# Patient Record
Sex: Female | Born: 1965 | Hispanic: No | Marital: Single | Smoking: Never smoker
Health system: Southern US, Community
[De-identification: ages and names within clinical notes are randomized; demographics above are authoritative.]

---

## 2017-05-15 ENCOUNTER — Other Ambulatory Visit: Payer: Self-pay | Admitting: Physician Assistant

## 2017-05-15 DIAGNOSIS — Z1231 Encounter for screening mammogram for malignant neoplasm of breast: Secondary | ICD-10-CM

## 2019-10-25 ENCOUNTER — Encounter (HOSPITAL_BASED_OUTPATIENT_CLINIC_OR_DEPARTMENT_OTHER): Payer: Self-pay | Admitting: Emergency Medicine

## 2019-10-25 ENCOUNTER — Observation Stay (HOSPITAL_BASED_OUTPATIENT_CLINIC_OR_DEPARTMENT_OTHER)
Admission: EM | Admit: 2019-10-25 | Discharge: 2019-10-27 | Disposition: A | Discharge status: Home / Self Care | Payer: BLUE CROSS/BLUE SHIELD | Attending: Physician Assistant | Admitting: Physician Assistant

## 2019-10-25 ENCOUNTER — Emergency Department (HOSPITAL_BASED_OUTPATIENT_CLINIC_OR_DEPARTMENT_OTHER): Payer: BLUE CROSS/BLUE SHIELD

## 2019-10-25 DIAGNOSIS — Z419 Encounter for procedure for purposes other than remedying health state, unspecified: Secondary | ICD-10-CM

## 2019-10-25 DIAGNOSIS — Z20822 Contact with and (suspected) exposure to covid-19: Secondary | ICD-10-CM | POA: Insufficient documentation

## 2019-10-25 DIAGNOSIS — K819 Cholecystitis, unspecified: Secondary | ICD-10-CM | POA: Diagnosis present

## 2019-10-25 DIAGNOSIS — K8000 Calculus of gallbladder with acute cholecystitis without obstruction: Principal | ICD-10-CM | POA: Insufficient documentation

## 2019-10-25 DIAGNOSIS — R1010 Upper abdominal pain, unspecified: Secondary | ICD-10-CM | POA: Diagnosis present

## 2019-10-25 LAB — COMPREHENSIVE METABOLIC PANEL
ALT: 19 U/L (ref 0–44)
AST: 25 U/L (ref 15–41)
Albumin: 4.5 g/dL (ref 3.5–5.0)
Alkaline Phosphatase: 51 U/L (ref 38–126)
Anion gap: 10 (ref 5–15)
BUN: 10 mg/dL (ref 6–20)
CO2: 25 mmol/L (ref 22–32)
Calcium: 9.1 mg/dL (ref 8.9–10.3)
Chloride: 97 mmol/L — ABNORMAL LOW (ref 98–111)
Creatinine, Ser: 0.56 mg/dL (ref 0.44–1.00)
GFR, Estimated: >60 mL/min (ref >60)
Glucose, Bld: 132 mg/dL — ABNORMAL HIGH (ref 70–99)
Potassium: 3.5 mmol/L (ref 3.5–5.1)
Sodium: 132 mmol/L — ABNORMAL LOW (ref 135–145)
Total Bilirubin: 0.4 mg/dL (ref 0.3–1.2)
Total Protein: 8.3 g/dL — ABNORMAL HIGH (ref 6.5–8.1)

## 2019-10-25 LAB — PREGNANCY, URINE: Preg Test, Ur: NEGATIVE

## 2019-10-25 LAB — URINALYSIS, ROUTINE W REFLEX MICROSCOPIC
Bilirubin Urine: NEGATIVE
Glucose, UA: NEGATIVE mg/dL
Ketones, ur: NEGATIVE mg/dL
Leukocytes,Ua: NEGATIVE
Nitrite: NEGATIVE
Protein, ur: NEGATIVE mg/dL
Specific Gravity, Urine: >1.03 — ABNORMAL HIGH (ref 1.005–1.030)
pH: 6 (ref 5.0–8.0)

## 2019-10-25 LAB — CBC
HCT: 28.2 % — ABNORMAL LOW (ref 36.0–46.0)
Hemoglobin: 8.2 g/dL — ABNORMAL LOW (ref 12.0–15.0)
MCH: 19.4 pg — ABNORMAL LOW (ref 26.0–34.0)
MCHC: 29.1 g/dL — ABNORMAL LOW (ref 30.0–36.0)
MCV: 66.8 fL — ABNORMAL LOW (ref 80.0–100.0)
Platelets: 264 10*3/uL (ref 150–400)
RBC: 4.22 MIL/uL (ref 3.87–5.11)
RDW: 17.6 % — ABNORMAL HIGH (ref 11.5–15.5)
WBC: 19.2 10*3/uL — ABNORMAL HIGH (ref 4.0–10.5)
nRBC: 0 % (ref 0.0–0.2)

## 2019-10-25 LAB — URINALYSIS, MICROSCOPIC (REFLEX)

## 2019-10-25 LAB — LIPASE, BLOOD: Lipase: 29 U/L (ref 11–51)

## 2019-10-25 LAB — RESPIRATORY PANEL BY RT PCR (FLU A&B, COVID)
Influenza A by PCR: NEGATIVE
Influenza B by PCR: NEGATIVE
SARS Coronavirus 2 by RT PCR: NEGATIVE

## 2019-10-25 MED ORDER — ENOXAPARIN SODIUM 40 MG/0.4ML ~~LOC~~ SOLN: 40.0000 mg | SUBCUTANEOUS | Status: DC

## 2019-10-25 MED ORDER — ONDANSETRON HCL 4 MG/2ML IJ SOLN
4.0000 mg | Freq: Once | INTRAMUSCULAR | Status: AC | PRN
  Administered 2019-10-25: 4 mg via INTRAVENOUS
  Filled 2019-10-25: qty 2

## 2019-10-25 MED ORDER — ENOXAPARIN SODIUM 40 MG/0.4ML ~~LOC~~ SOLN
SUBCUTANEOUS | Status: AC
  Filled 2019-10-25: qty 0.4

## 2019-10-25 MED ORDER — PANTOPRAZOLE SODIUM 40 MG IV SOLR
40.0000 mg | Freq: Once | INTRAVENOUS | Status: AC
  Administered 2019-10-25: 40 mg via INTRAVENOUS
  Filled 2019-10-25: qty 40

## 2019-10-25 MED ORDER — MORPHINE SULFATE (PF) 4 MG/ML IV SOLN
4.0000 mg | Freq: Once | INTRAVENOUS | Status: AC
  Administered 2019-10-25: 4 mg via INTRAVENOUS
  Filled 2019-10-25: qty 1

## 2019-10-25 MED ORDER — MORPHINE SULFATE (PF) 2 MG/ML IV SOLN
2.0000 mg | INTRAVENOUS | Status: DC | PRN
  Administered 2019-10-25 – 2019-10-26 (×2): 2 mg via INTRAVENOUS
  Filled 2019-10-25 (×2): qty 1

## 2019-10-25 MED ORDER — ALUM & MAG HYDROXIDE-SIMETH 200-200-20 MG/5ML PO SUSP
30.0000 mL | Freq: Once | ORAL | Status: AC
  Administered 2019-10-25: 30 mL via ORAL
  Filled 2019-10-25: qty 30

## 2019-10-25 MED ORDER — SODIUM CHLORIDE 0.9 % IV SOLN
2.0000 g | Freq: Once | INTRAVENOUS | Status: AC
  Administered 2019-10-25: 2 g via INTRAVENOUS
  Filled 2019-10-25: qty 20

## 2019-10-25 MED ORDER — ONDANSETRON 4 MG PO TBDP
4.0000 mg | ORAL_TABLET | Freq: Four times a day (QID) | ORAL | Status: DC | PRN
  Filled 2019-10-25: qty 1

## 2019-10-25 MED ORDER — DIPHENHYDRAMINE HCL 25 MG PO CAPS
25.0000 mg | ORAL_CAPSULE | Freq: Four times a day (QID) | ORAL | Status: DC | PRN
  Filled 2019-10-25: qty 1

## 2019-10-25 MED ORDER — SODIUM CHLORIDE 0.9 % IV SOLN: 2.0000 g | INTRAVENOUS | Status: DC

## 2019-10-25 MED ORDER — IOHEXOL 300 MG/ML  SOLN
75.0000 mL | Freq: Once | INTRAMUSCULAR | Status: AC | PRN
  Administered 2019-10-25: 100 mL via INTRAVENOUS

## 2019-10-25 MED ORDER — SODIUM CHLORIDE 0.9 % IV BOLUS
500.0000 mL | Freq: Once | INTRAVENOUS | Status: AC
  Administered 2019-10-25: 500 mL via INTRAVENOUS

## 2019-10-25 MED ORDER — LIDOCAINE VISCOUS HCL 2 % MT SOLN
15.0000 mL | Freq: Once | OROMUCOSAL | Status: AC
  Administered 2019-10-25: 15 mL via ORAL
  Filled 2019-10-25: qty 15

## 2019-10-25 MED ORDER — ONDANSETRON HCL 4 MG/2ML IJ SOLN: 4.0000 mg | Freq: Four times a day (QID) | INTRAMUSCULAR | Status: DC | PRN

## 2019-10-25 MED ORDER — DIPHENHYDRAMINE HCL 50 MG/ML IJ SOLN: 25.0000 mg | Freq: Four times a day (QID) | INTRAMUSCULAR | Status: DC | PRN

## 2019-10-25 MED ORDER — KCL IN DEXTROSE-NACL 20-5-0.45 MEQ/L-%-% IV SOLN
INTRAVENOUS | Status: DC
  Filled 2019-10-25 (×2): qty 1000

## 2019-10-25 NOTE — Progress Notes (Signed)
Patient arrived via EMS on stretcher at 2315. Admissions MD notified via AMION at 2319.

## 2019-10-25 NOTE — ED Notes (Signed)
Pt speaks Vietnamese  

## 2019-10-25 NOTE — ED Provider Notes (Signed)
[facility] EMERGENCY DEPARTMENT Provider Note   CSN: [id number] Arrival date & time: 10/25/19  1725     History Chief Complaint  Patient presents with  . Abdominal Pain    Amber Gardner is a 54 y.o. female.  Amber Gardner is a 54 y.o. female who is otherwise healthy, presents to the emergency department for evaluation of upper abdominal pain.  She states that pain started around 1 AM this morning and has been a constant aching feeling.  She reports associated nausea with one episode of vomiting around noon.  She denies any hematemesis.  Reports normal bowel movements that have been nonbloody, no melena.  She denies any fevers or chills.  Patient's daughter adds that she has been complaining of similar episodes of this pain intermittently since Friday but today has been the worst.  At first they thought that she just had food poisoning or had eaten something that bothered her stomach, but her daughter was concerned when pain was worse and more persistent today.  No prior history of abdominal surgeries.  Patient denies dysuria or urinary frequency.  No vaginal bleeding but does report history of heavy periods, most recently on 10/11.  Patient did take some Pepcid to try and help symptoms prior to arrival without relief.  No other aggravating or alleviating factors.        History reviewed. No pertinent past medical history.  Patient Active Problem List   Diagnosis Date Noted  . Cholecystitis 10/25/2019    History reviewed. No pertinent surgical history.   OB History   No obstetric history on file.     No family history on file.  Social History   Tobacco Use  . Smoking status: Never Smoker  . Smokeless tobacco: Never Used  Substance Use Topics  . Alcohol use: Not Currently  . Drug use: Never    Home Medications Prior to Admission medications   Not on File    Allergies    Patient has no known allergies.  Review of Systems   Review of Systems  Constitutional:  Negative for chills and fever.  HENT: Negative.   Respiratory: Negative for cough and shortness of breath.   Cardiovascular: Negative for chest pain.  Gastrointestinal: Positive for abdominal pain, nausea and vomiting. Negative for blood in stool, constipation and diarrhea.  Genitourinary: Negative for dysuria, flank pain, frequency and pelvic pain.  Musculoskeletal: Negative for arthralgias and myalgias.  Skin: Negative for color change and rash.  Neurological: Negative for dizziness, syncope and light-headedness.  All other systems reviewed and are negative.  Physical Exam Updated Vital Signs BP (!) 146/93   Pulse 99   Temp 99.2 F (37.3 C) (Oral)   Resp 18   Ht 5\' 4"  (1.626 m)   Wt 52.2 kg   LMP 10/12/2019   SpO2 99%   BMI 19.74 kg/m   Physical Exam  Vitals and nursing note reviewed.  Constitutional:      General: She is not in acute distress.    Appearance: She is well-developed. She is not diaphoretic.     Comments: Mildly ill-appearing but in no acute distress  HENT:     Head: Normocephalic and atraumatic.     Mouth/Throat:     Mouth: Mucous membranes are moist.     Pharynx: Oropharynx is clear.  Eyes:     General:        Right eye: No discharge.        Left eye: No discharge.  Pupils: Pupils are equal, round, and reactive to light.  Cardiovascular:     Rate and Rhythm: Normal rate and regular rhythm.     Heart sounds: Normal heart sounds.  Pulmonary:     Effort: Pulmonary effort is normal. No respiratory distress.     Breath sounds: Normal breath sounds. No wheezing or rales.     Comments: Respirations equal and unlabored, patient able to speak in full sentences, lungs clear to auscultation bilaterally Abdominal:     General: Bowel sounds are normal. There is no distension.     Palpations: Abdomen is soft. There is no mass.     Tenderness: There is abdominal tenderness in the epigastric area. There is no guarding.     Comments: Abdomen is soft,  nondistended, bowel sounds present throughout, there is tenderness primarily in the epigastric region without guarding or rebound tenderness, patient without focal right upper quadrant tenderness, no lower abdominal tenderness or CVA tenderness bilaterally.  No guarding or peritoneal signs.  Musculoskeletal:        General: No deformity.     Cervical back: Neck supple.  Skin:    General: Skin is warm and dry.     Capillary Refill: Capillary refill takes less than 2 seconds.  Neurological:     Mental Status: She is alert and oriented to person, place, and time.     Coordination: Coordination normal.     Comments: Speech is clear, able to follow commands Moves extremities without ataxia, coordination intact  Psychiatric:        Mood and Affect: Mood normal.        Behavior: Behavior normal.   ED Results / Procedures / Treatments   Labs (all labs ordered are listed, but only abnormal results are displayed) Labs Reviewed  COMPREHENSIVE METABOLIC PANEL - Abnormal; Notable for the following components:      Result Value   Sodium 132 (*)    Chloride 97 (*)    Glucose, Bld 132 (*)    Total Protein 8.3 (*)    All other components within normal limits  URINALYSIS, ROUTINE W REFLEX MICROSCOPIC - Abnormal; Notable for the following components:   Specific Gravity, Urine >1.030 (*)    Hgb urine dipstick TRACE (*)    All other components within normal limits  URINALYSIS, MICROSCOPIC (REFLEX) - Abnormal; Notable for the following components:   Bacteria, UA MANY (*)    All other components within normal limits  CBC - Abnormal; Notable for the following components:   WBC 19.2 (*)    Hemoglobin 8.2 (*)    HCT 28.2 (*)    MCV 66.8 (*)    MCH 19.4 (*)    MCHC 29.1 (*)    RDW 17.6 (*)    All other components within normal limits  RESPIRATORY PANEL BY RT PCR (FLU A&B, COVID)  LIPASE, BLOOD  PREGNANCY, URINE  HIV ANTIBODY (ROUTINE TESTING W REFLEX)  COMPREHENSIVE METABOLIC PANEL  CBC     EKG EKG Interpretation  Date/Time:  Sunday October 25 2019 17:41:32 EDT Ventricular Rate:  80 PR Interval:  138 QRS Duration: 68 QT Interval:  388 QTC Calculation: 447 R Axis:   62 Text Interpretation: Normal sinus rhythm Nonspecific ST abnormality Abnormal ECG No old tracing to compare Confirmed by Meridee Score [phone]) on 10/25/2019 6:08:27 PM   Radiology CT ABDOMEN PELVIS W CONTRAST  Result Date: 10/25/2019 CLINICAL DATA:  Epigastric pain. EXAM: CT ABDOMEN AND PELVIS WITH CONTRAST TECHNIQUE: Multidetector CT imaging of  the abdomen and pelvis was performed using the standard protocol following bolus administration of intravenous contrast. CONTRAST:  OMNIPAQUE IOHEXOL 300 MG/ML  SOLN COMPARISON:  None. FINDINGS: Lower chest: There is a trace right-sided pleural effusion with adjacent atelectasis.The heart size is normal. Hepatobiliary: The liver is normal. There appears to be diffuse gallbladder wall thickening. The gallbladder is distended. Multiple gallstones are noted. There appear to be gallstones at the level of the cystic duct. There is intrahepatic and extrahepatic biliary ductal dilatation. Pancreas: Normal contours without ductal dilatation. No peripancreatic fluid collection. Spleen: Unremarkable. Adrenals/Urinary Tract: --Adrenal glands: Unremarkable. --Right kidney/ureter: No hydronephrosis or radiopaque kidney stones. --Left kidney/ureter: No hydronephrosis or radiopaque kidney stones. --Urinary bladder: Unremarkable. Stomach/Bowel: --Stomach/Duodenum: No hiatal hernia or other gastric abnormality. Normal duodenal course and caliber. --Small bowel: Unremarkable. --Colon: Unremarkable. --Appendix: Normal. Vascular/Lymphatic: Normal course and caliber of the major abdominal vessels. --No retroperitoneal lymphadenopathy. --No mesenteric lymphadenopathy. --No pelvic or inguinal lymphadenopathy. Reproductive: The uterus is enlarged with multiple fibroids. Other: There is a  small volume of pelvic free fluid which is likely physiologic. No free air. The abdominal wall is normal. Musculoskeletal. No acute displaced fractures. IMPRESSION: 1. Findings concerning for acute cholecystitis with gallstones at the level of the cystic duct. There is intrahepatic and extrahepatic biliary ductal dilatation which raises suspicion for choledocholithiasis. Consider further evaluation by ultrasound as clinically indicated. 2. Trace right-sided pleural effusion with adjacent atelectasis. 3. Fibroid uterus. 4. Small volume of pelvic free fluid is likely physiologic. Electronically Signed   By: Katherine Mantle M.D.   On: 10/25/2019 20:06   US Abdomen Limited RUQ (LIVER/GB)  Result Date: 10/25/2019 CLINICAL DATA:  Epigastric pain with nausea and vomiting. Abnormal CT scan today. Positive Murphy's. EXAM: ULTRASOUND ABDOMEN LIMITED RIGHT UPPER QUADRANT COMPARISON:  Abdominopelvic CT earlier today. FINDINGS: Gallbladder: Distended containing intraluminal gallstones as well as a 1 cm shadowing stone in the cystic duct. There is diffuse gallbladder wall thickening at 4 mm. Small amount of pericholecystic fluid. Positive sonographic Eulah Pont sign was noted by the sonographer. Common bile duct: Diameter: Dilated at 8 mm.  No demonstrated choledocholithiasis. Liver: Cyst in the left lobe of the liver on CT is not well seen by ultrasound. There is central intrahepatic biliary ductal dilatation. Portal vein is patent on color Doppler imaging with normal direction of blood flow towards the liver. Other: None. IMPRESSION: 1. Sonographic findings consistent with acute cholecystitis. Distended gallbladder with intraluminal gallstones, 1 cm stone in the cystic duct, gallbladder wall thickening, pericholecystic fluid, and positive sonographic Murphy sign. 2. Intrahepatic and extrahepatic biliary ductal dilatation, common bile duct measures 8 mm. No demonstrated choledocholithiasis by ultrasound. Electronically  Signed   By: Narda Rutherford M.D.   On: 10/25/2019 21:05    Procedures Procedures (including critical care time)  Medications Ordered in ED Medications  enoxaparin (LOVENOX) injection 40 mg (has no administration in time range)  dextrose 5 % and 0.45 % NaCl with KCl 20 mEq/L infusion ( Intravenous New Bag/Given 10/25/19 2204)  cefTRIAXone (ROCEPHIN) 2 g in sodium chloride 0.9 % 100 mL IVPB (has no administration in time range)  morphine 2 MG/ML injection 2 mg (has no administration in time range)  diphenhydrAMINE (BENADRYL) capsule 25 mg (has no administration in time range)    Or  diphenhydrAMINE (BENADRYL) injection 25 mg (has no administration in time range)  ondansetron (ZOFRAN-ODT) disintegrating tablet 4 mg (has no administration in time range)    Or  ondansetron (ZOFRAN) injection 4 mg (has  no administration in time range)  enoxaparin (LOVENOX) 40 MG/0.4ML injection (has no administration in time range)  ondansetron (ZOFRAN) injection 4 mg (4 mg Intravenous Given 10/25/19 1753)  sodium chloride 0.9 % bolus 500 mL (0 mLs Intravenous Stopped 10/25/19 2050)  pantoprazole (PROTONIX) injection 40 mg (40 mg Intravenous Given 10/25/19 1927)  alum & mag hydroxide-simeth (MAALOX/MYLANTA) 200-200-20 MG/5ML suspension 30 mL (30 mLs Oral Given 10/25/19 1925)    And  lidocaine (XYLOCAINE) 2 % viscous mouth solution 15 mL (15 mLs Oral Given 10/25/19 1925)  iohexol (OMNIPAQUE) 300 MG/ML solution 75 mL (100 mLs Intravenous Contrast Given 10/25/19 1952)  morphine 4 MG/ML injection 4 mg (4 mg Intravenous Given 10/25/19 2101)  cefTRIAXone (ROCEPHIN) 2 g in sodium chloride 0.9 % 100 mL IVPB (0 g Intravenous Stopped 10/25/19 2134)    ED Course  I have reviewed the triage vital signs and the nursing notes.  Pertinent labs & imaging results that were available during my care of the patient were reviewed by me and considered in my medical decision making (see chart for details).    MDM  Rules/Calculators/A&P                         Patient presents to the ED with complaints of epigastric abdominal pain and vomiting. Patient nontoxic appearing, in no apparent distress, vitals WNL aside from mild hypertension. On exam patient tender to in the epigastric region without guarding, no focal right upper quadrant tenderness or other tenderness, no peritoneal signs. Will evaluate with labs. Analgesics, anti-emetics, and fluids administered.   I have independently ordered, reviewed and interpreted all labs and imaging:  CBC: Significant leukocytosis of 19.2, hemoglobin of 8.2, which appears to be chronic, most recent prior hemoglobin of 8.8, patient with history of heavy menses CMP: Sodium of 132, chloride of 97 but no other significant electrolyte derangements, normal renal and liver function Lipase: WNL UA: Many bacteria noted but with no other signs of infection, patient without urinary symptoms, do not feel that this will require treatment at this time Preg test: Negative  Patient with continued pain despite initial treatment with GI cocktail, Protonix and Zofran, given significant leukocytosis will get CT to further evaluate.  CT with findings concerning for acute cholecystitis with gallstones at the level of the cystic duct, there is also intra and extrahepatic ductal dilatation which raises concern for choledocholithiasis, recommend ultrasound for further evaluation.  Fibroid uterus noted as well.  Ultrasound with sonographic findings again consistent with acute cholecystitis, distended gallbladder with 1 cm stone in the cystic duct, wall thickening, pericholecystic fluid, there is again intrahepatic and extrahepatic ductal dilatation noted but no demonstrated choledocholithiasis.  Patient has no elevated LFTs which is reassuring.  Will consult general surgery.  Case discussed with Dr. Maisie Fushomas with general surgery who accepts patient for transfer and admission to surgery service at  [facility] for chole[facility].  Temporary admit orders have been placed, patient will be transported via [facility] to [facility].  I have discussed care plan with patient and her daughter at length and answered all questions.   Final Clinical Impression(s) / ED Diagnoses Final diagnoses:  Cholecystitis    Rx / DC Orders ED Discharge Orders    None       Legrand RamsFord, Kebra Lowrimore N, PA-C 10/25/19 2228    Terrilee FilesButler, Michael C, MD 10/26/19 1106

## 2019-10-25 NOTE — ED Triage Notes (Addendum)
Epigastric pain and vomiting since 1am. Took famotidine without relief

## 2019-10-25 NOTE — ED Notes (Signed)
Pt vomited in triage

## 2019-10-25 NOTE — ED Notes (Signed)
Unsuccessful phlebectomy attempt [facility]

## 2019-10-25 NOTE — H&P (Signed)
CC: RUQ pain  Requesting provider: Jodi Geralds, PA  HPI: Amber Gardner is an 54 y.o. female who is here for epigastric pain with nausea and vomiting.  She has had this pain for 3 days.  No fevers.    History reviewed. No pertinent past medical history.  History reviewed. No pertinent surgical history.  No family history on file.  Social:  reports that she has never smoked. She has never used smokeless tobacco. She reports previous alcohol use. She reports that she does not use drugs.  Allergies: No Known Allergies  Medications: I have reviewed the patient's current medications.  Results for orders placed or performed during the hospital encounter of 10/25/19 (from the past 48 hour(s))  Lipase, blood     Status: None   Collection Time: 10/25/19  5:50 PM  Result Value Ref Range   Lipase 29 11 - 51 U/L    Comment: Performed at [facility], [address] [place], [place] [address]  Comprehensive metabolic panel     Status: Abnormal   Collection Time: 10/25/19  5:50 PM  Result Value Ref Range   Sodium 132 (L) 135 - 145 mmol/L   Potassium 3.5 3.5 - 5.1 mmol/L   Chloride 97 (L) 98 - 111 mmol/L   CO2 25 22 - 32 mmol/L   Glucose, Bld 132 (H) 70 - 99 mg/dL    Comment: Glucose reference range applies only to samples taken after fasting for at least 8 hours.   BUN 10 6 - 20 mg/dL   Creatinine, Ser 6.04 0.44 - 1.00 mg/dL   Calcium 9.1 8.9 - 54.0 mg/dL   Total Protein 8.3 (H) 6.5 - 8.1 g/dL   Albumin 4.5 3.5 - 5.0 g/dL   AST 25 15 - 41 U/L   ALT 19 0 - 44 U/L   Alkaline Phosphatase 51 38 - 126 U/L   Total Bilirubin 0.4 0.3 - 1.2 mg/dL   GFR, Estimated >98 >11 mL/min    Comment: (NOTE) Calculated using the CKD-EPI Creatinine Equation (2021)    Anion gap 10 5 - 15    Comment: Performed at [facility], [address] [place], [place] [address]  Urinalysis, Routine w reflex microscopic     Status: Abnormal   Collection Time: 10/25/19  5:51 PM  Result Value Ref  Range   Color, Urine YELLOW YELLOW   APPearance CLEAR CLEAR   Specific Gravity, Urine >1.030 (H) 1.005 - 1.030   pH 6.0 5.0 - 8.0   Glucose, UA NEGATIVE NEGATIVE mg/dL   Hgb urine dipstick TRACE (A) NEGATIVE   Bilirubin Urine NEGATIVE NEGATIVE   Ketones, ur NEGATIVE NEGATIVE mg/dL   Protein, ur NEGATIVE NEGATIVE mg/dL   Nitrite NEGATIVE NEGATIVE   Leukocytes,Ua NEGATIVE NEGATIVE    Comment: Performed at [facility], [address] [place], [place] [address]  Pregnancy, urine     Status: None   Collection Time: 10/25/19  5:51 PM  Result Value Ref Range   Preg Test, Ur NEGATIVE NEGATIVE    Comment:        THE SENSITIVITY OF THIS METHODOLOGY IS >20 mIU/mL. Performed at [facility], [address] [place], [place] [address]   Urinalysis, Microscopic (reflex)     Status: Abnormal   Collection Time: 10/25/19  5:51 PM  Result Value Ref Range   RBC / HPF 0-5 0 - 5 RBC/hpf   WBC, UA 0-5 0 -  5 WBC/hpf   Bacteria, UA MANY (A) NONE SEEN   Squamous Epithelial / LPF 0-5 0 - 5   Mucus PRESENT     Comment: Performed at [facility], [address] [place], [place] [address]  CBC     Status: Abnormal   Collection Time: 10/25/19  7:20 PM  Result Value Ref Range   WBC 19.2 (H) 4.0 - 10.5 K/uL   RBC 4.22 3.87 - 5.11 MIL/uL   Hemoglobin 8.2 (L) 12.0 - 15.0 g/dL    Comment: Reticulocyte Hemoglobin testing may be clinically indicated, consider ordering this additional test [id number]    HCT 28.2 (L) 36 - 46 %   MCV 66.8 (L) 80.0 - 100.0 fL   MCH 19.4 (L) 26.0 - 34.0 pg   MCHC 29.1 (L) 30.0 - 36.0 g/dL   RDW 54.2 (H) 70.6 - 23.7 %   Platelets 264 150 - 400 K/uL   nRBC 0.0 0.0 - 0.2 %    Comment: Performed at [facility], [address] [place], [place] [address]  Respiratory Panel by RT PCR (Flu A&B, Covid) - Nasopharyngeal Swab     Status: None   Collection Time: 10/25/19  8:51 PM   Specimen: Nasopharyngeal Swab  Result Value Ref Range    SARS Coronavirus 2 by RT PCR NEGATIVE NEGATIVE    Comment: (NOTE) SARS-CoV-2 target nucleic acids are NOT DETECTED.  The SARS-CoV-2 RNA is generally detectable in upper respiratoy specimens during the acute phase of infection. The lowest concentration of SARS-CoV-2 viral copies this assay can detect is 131 copies/mL. A negative result does not preclude SARS-Cov-2 infection and should not be used as the sole basis for treatment or other patient management decisions. A negative result may occur with  improper specimen collection/handling, submission of specimen other than nasopharyngeal swab, presence of viral mutation(s) within the areas targeted by this assay, and inadequate number of viral copies (<131 copies/mL). A negative result must be combined with clinical observations, patient history, and epidemiological information. The expected result is Negative.  Fact Sheet for Patients:  https://www.moore.com/  Fact Sheet for Healthcare Providers:  https://www.young.biz/  This test is no t yet approved or cleared by the [place] FDA and  has been authorized for detection and/or diagnosis of SARS-CoV-2 by FDA under an Emergency Use Authorization (EUA). This EUA will remain  in effect (meaning this test can be used) for the duration of the COVID-19 declaration under Section 564(b)(1) of the Act, 21 U.S.C. section 360bbb-3(b)(1), unless the authorization is terminated or revoked sooner.     Influenza A by PCR NEGATIVE NEGATIVE   Influenza B by PCR NEGATIVE NEGATIVE    Comment: (NOTE) The Xpert Xpress SARS-CoV-2/FLU/RSV assay is intended as an aid in  the diagnosis of influenza from Nasopharyngeal swab specimens and  should not be used as a sole basis for treatment. Nasal washings and  aspirates are unacceptable for Xpert Xpress SARS-CoV-2/FLU/RSV  testing.  Fact Sheet for Patients: https://www.moore.com/  Fact Sheet  for Healthcare Providers: https://www.young.biz/  This test is not yet approved or cleared by the [place] FDA and  has been authorized for detection and/or diagnosis of SARS-CoV-2 by  FDA under an Emergency Use Authorization (EUA). This EUA will remain  in effect (meaning this test can be used) for the duration of the  Covid-19 declaration under Section 564(b)(1) of the Act, 21  U.S.C. section 360bbb-3(b)(1), unless the authorization is  terminated or revoked. Performed  at [facility], [address] [place], [place] [address]     CT ABDOMEN PELVIS W CONTRAST  Result Date: 10/25/2019 CLINICAL DATA:  Epigastric pain. EXAM: CT ABDOMEN AND PELVIS WITH CONTRAST TECHNIQUE: Multidetector CT imaging of the abdomen and pelvis was performed using the standard protocol following bolus administration of intravenous contrast. CONTRAST:  OMNIPAQUE IOHEXOL 300 MG/ML  SOLN COMPARISON:  None. FINDINGS: Lower chest: There is a trace right-sided pleural effusion with adjacent atelectasis.The heart size is normal. Hepatobiliary: The liver is normal. There appears to be diffuse gallbladder wall thickening. The gallbladder is distended. Multiple gallstones are noted. There appear to be gallstones at the level of the cystic duct. There is intrahepatic and extrahepatic biliary ductal dilatation. Pancreas: Normal contours without ductal dilatation. No peripancreatic fluid collection. Spleen: Unremarkable. Adrenals/Urinary Tract: --Adrenal glands: Unremarkable. --Right kidney/ureter: No hydronephrosis or radiopaque kidney stones. --Left kidney/ureter: No hydronephrosis or radiopaque kidney stones. --Urinary bladder: Unremarkable. Stomach/Bowel: --Stomach/Duodenum: No hiatal hernia or other gastric abnormality. Normal duodenal course and caliber. --Small bowel: Unremarkable. --Colon: Unremarkable. --Appendix: Normal. Vascular/Lymphatic: Normal course and caliber of the major  abdominal vessels. --No retroperitoneal lymphadenopathy. --No mesenteric lymphadenopathy. --No pelvic or inguinal lymphadenopathy. Reproductive: The uterus is enlarged with multiple fibroids. Other: There is a small volume of pelvic free fluid which is likely physiologic. No free air. The abdominal wall is normal. Musculoskeletal. No acute displaced fractures. IMPRESSION: 1. Findings concerning for acute cholecystitis with gallstones at the level of the cystic duct. There is intrahepatic and extrahepatic biliary ductal dilatation which raises suspicion for choledocholithiasis. Consider further evaluation by ultrasound as clinically indicated. 2. Trace right-sided pleural effusion with adjacent atelectasis. 3. Fibroid uterus. 4. Small volume of pelvic free fluid is likely physiologic. Electronically Signed   By: Katherine Mantle M.D.   On: 10/25/2019 20:06   US Abdomen Limited RUQ (LIVER/GB)  Result Date: 10/25/2019 CLINICAL DATA:  Epigastric pain with nausea and vomiting. Abnormal CT scan today. Positive Murphy's. EXAM: ULTRASOUND ABDOMEN LIMITED RIGHT UPPER QUADRANT COMPARISON:  Abdominopelvic CT earlier today. FINDINGS: Gallbladder: Distended containing intraluminal gallstones as well as a 1 cm shadowing stone in the cystic duct. There is diffuse gallbladder wall thickening at 4 mm. Small amount of pericholecystic fluid. Positive sonographic Eulah Pont sign was noted by the sonographer. Common bile duct: Diameter: Dilated at 8 mm.  No demonstrated choledocholithiasis. Liver: Cyst in the left lobe of the liver on CT is not well seen by ultrasound. There is central intrahepatic biliary ductal dilatation. Portal vein is patent on color Doppler imaging with normal direction of blood flow towards the liver. Other: None. IMPRESSION: 1. Sonographic findings consistent with acute cholecystitis. Distended gallbladder with intraluminal gallstones, 1 cm stone in the cystic duct, gallbladder wall thickening,  pericholecystic fluid, and positive sonographic Murphy sign. 2. Intrahepatic and extrahepatic biliary ductal dilatation, common bile duct measures 8 mm. No demonstrated choledocholithiasis by ultrasound. Electronically Signed   By: Narda Rutherford M.D.   On: 10/25/2019 21:05    ROS - all of the below systems have been reviewed with the patient and positives are indicated with bold text General: chills, fever or night sweats Eyes: blurry vision or double vision ENT: epistaxis or sore throat Allergy/Immunology: itchy/watery eyes or nasal congestion Hematologic/Lymphatic: bleeding problems, blood clots or swollen lymph nodes Endocrine: temperature intolerance or unexpected weight changes Breast: new or changing breast lumps or nipple discharge Resp: cough, shortness of breath, or wheezing CV: chest pain or dyspnea on exertion GI: as  per HPI GU: dysuria, trouble voiding, or hematuria MSK: joint pain or joint stiffness Neuro: TIA or stroke symptoms Derm: pruritus and skin lesion changes Psych: anxiety and depression  PE Blood pressure 130/86, pulse 96, temperature 100.2 F (37.9 C), temperature source Oral, resp. rate (!) 25, height 5\' 4"  (1.626 m), weight 52.2 kg, last menstrual period 10/12/2019, SpO2 99 %. Constitutional: NAD; conversant; no deformities Eyes: Moist conjunctiva; no lid lag; anicteric; PERRL Neck: Trachea midline; no thyromegaly Lungs: Normal respiratory effort; no tactile fremitus CV: RRR; no palpable thrills; no pitting edema GI: Abd RUQ TTP; no palpable hepatosplenomegaly MSK: Normal range of motion of extremities; no clubbing/cyanosis Psychiatric: Appropriate affect; alert and oriented x3 Lymphatic: No palpable cervical or axillary lymphadenopathy  Results for orders placed or performed during the hospital encounter of 10/25/19 (from the past 48 hour(s))  Lipase, blood     Status: None   Collection Time: 10/25/19  5:50 PM  Result Value Ref Range   Lipase 29 11 -  51 U/L    Comment: Performed at [facility] [address] [place], [place]  Comprehensive metabolic panel     Status: Abnormal   Collection Time: 10/25/19  5:50 PM  Result Value Ref Range   Sodium 132 (L) 135 - 145 mmol/L   Potassium 3.5 3.5 - 5.1 mmol/L   Chloride 97 (L) 98 - 111 mmol/L   CO2 25 22 - 32 mmol/L   Glucose, Bld 132 (H) 70 - 99 mg/dL    Comment: Glucose reference range applies only to samples taken after fasting for at least 8 hours.   BUN 10 6 - 20 mg/dL   Creatinine, Ser 10/27/19 0.44 - 1.00 mg/dL   Calcium 9.1 8.9 - 9.62 mg/dL   Total Protein 8.3 (H) 6.5 - 8.1 g/dL   Albumin 4.5 3.5 - 5.0 g/dL   AST 25 15 - 41 U/L   ALT 19 0 - 44 U/L   Alkaline Phosphatase 51 38 - 126 U/L   Total Bilirubin 0.4 0.3 - 1.2 mg/dL   GFR, Estimated 83.6 >62 mL/min    Comment: (NOTE) Calculated using the CKD-EPI Creatinine Equation (2021)    Anion gap 10 5 - 15    Comment: Performed at [facility], [address] [place], [place]  Urinalysis, Routine w reflex microscopic     Status: Abnormal   Collection Time: 10/25/19  5:51 PM  Result Value Ref Range   Color, Urine YELLOW YELLOW   APPearance CLEAR CLEAR   Specific Gravity, Urine >1.030 (H) 1.005 - 1.030   pH 6.0 5.0 - 8.0   Glucose, UA NEGATIVE NEGATIVE mg/dL   Hgb urine dipstick TRACE (A) NEGATIVE   Bilirubin Urine NEGATIVE NEGATIVE   Ketones, ur NEGATIVE NEGATIVE mg/dL   Protein, ur NEGATIVE NEGATIVE mg/dL   Nitrite NEGATIVE NEGATIVE   Leukocytes,Ua NEGATIVE NEGATIVE    Comment: Performed at [facility], [address] [place], [place]  Pregnancy, urine     Status: None   Collection Time: 10/25/19  5:51 PM  Result Value Ref Range   Preg Test, Ur NEGATIVE NEGATIVE    Comment:        THE SENSITIVITY OF THIS METHODOLOGY IS >20 mIU/mL. Performed at [facility], [address] [place], [place]   Urinalysis, Microscopic (reflex)      Status: Abnormal   Collection Time: 10/25/19  5:51 PM  Result Value Ref Range   RBC / HPF 0-5 0 - 5 RBC/hpf   WBC, UA 0-5 0 - 5 WBC/hpf   Bacteria, UA MANY (A) NONE SEEN   Squamous Epithelial / LPF 0-5 0 - 5   Mucus PRESENT     Comment: Performed at [facility], [address] [place], [place] [address]  CBC     Status: Abnormal   Collection Time: 10/25/19  7:20 PM  Result Value Ref Range   WBC 19.2 (H) 4.0 - 10.5 K/uL   RBC 4.22 3.87 - 5.11 MIL/uL   Hemoglobin 8.2 (L) 12.0 - 15.0 g/dL    Comment: Reticulocyte Hemoglobin testing may be clinically indicated, consider ordering this additional test [id number]    HCT 28.2 (L) 36 - 46 %   MCV 66.8 (L) 80.0 - 100.0 fL   MCH 19.4 (L) 26.0 - 34.0 pg   MCHC 29.1 (L) 30.0 - 36.0 g/dL   RDW 11.917.6 (H) 14.711.5 - 82.915.5 %   Platelets 264 150 - 400 K/uL   nRBC 0.0 0.0 - 0.2 %    Comment: Performed at [facility], [address] [place], [place] [address]  Respiratory Panel by RT PCR (Flu A&B, Covid) - Nasopharyngeal Swab     Status: None   Collection Time: 10/25/19  8:51 PM   Specimen: Nasopharyngeal Swab  Result Value Ref Range   SARS Coronavirus 2 by RT PCR NEGATIVE NEGATIVE    Comment: (NOTE) SARS-CoV-2 target nucleic acids are NOT DETECTED.  The SARS-CoV-2 RNA is generally detectable in upper respiratoy specimens during the acute phase of infection. The lowest concentration of SARS-CoV-2 viral copies this assay can detect is 131 copies/mL. A negative result does not preclude SARS-Cov-2 infection and should not be used as the sole basis for treatment or other patient management decisions. A negative result may occur with  improper specimen collection/handling, submission of specimen other than nasopharyngeal swab, presence of viral mutation(s) within the areas targeted by this assay, and inadequate number of viral copies (<131 copies/mL). A negative result must be combined with clinical observations, patient  history, and epidemiological information. The expected result is Negative.  Fact Sheet for Patients:  https://www.moore.com/https://www.fda.gov/media/142436/download  Fact Sheet for Healthcare Providers:  https://www.young.biz/https://www.fda.gov/media/142435/download  This test is no t yet approved or cleared by the [place] FDA and  has been authorized for detection and/or diagnosis of SARS-CoV-2 by FDA under an Emergency Use Authorization (EUA). This EUA will remain  in effect (meaning this test can be used) for the duration of the COVID-19 declaration under Section 564(b)(1) of the Act, 21 U.S.C. section 360bbb-3(b)(1), unless the authorization is terminated or revoked sooner.     Influenza A by PCR NEGATIVE NEGATIVE   Influenza B by PCR NEGATIVE NEGATIVE    Comment: (NOTE) The Xpert Xpress SARS-CoV-2/FLU/RSV assay is intended as an aid in  the diagnosis of influenza from Nasopharyngeal swab specimens and  should not be used as a sole basis for treatment. Nasal washings and  aspirates are unacceptable for Xpert Xpress SARS-CoV-2/FLU/RSV  testing.  Fact Sheet for Patients: https://www.moore.com/https://www.fda.gov/media/142436/download  Fact Sheet for Healthcare Providers: https://www.young.biz/https://www.fda.gov/media/142435/download  This test is not yet approved or cleared by the [place] FDA and  has been authorized for detection and/or diagnosis of SARS-CoV-2 by  FDA under an Emergency Use Authorization (EUA). This EUA will remain  in effect (meaning this test can be used) for the duration of the  Covid-19 declaration  under Section 564(b)(1) of the Act, 21  U.S.C. section 360bbb-3(b)(1), unless the authorization is  terminated or revoked. Performed at [facility], [address] [place], [place] [address]     CT ABDOMEN PELVIS W CONTRAST  Result Date: 10/25/2019 CLINICAL DATA:  Epigastric pain. EXAM: CT ABDOMEN AND PELVIS WITH CONTRAST TECHNIQUE: Multidetector CT imaging of the abdomen and pelvis was performed using the  standard protocol following bolus administration of intravenous contrast. CONTRAST:  OMNIPAQUE IOHEXOL 300 MG/ML  SOLN COMPARISON:  None. FINDINGS: Lower chest: There is a trace right-sided pleural effusion with adjacent atelectasis.The heart size is normal. Hepatobiliary: The liver is normal. There appears to be diffuse gallbladder wall thickening. The gallbladder is distended. Multiple gallstones are noted. There appear to be gallstones at the level of the cystic duct. There is intrahepatic and extrahepatic biliary ductal dilatation. Pancreas: Normal contours without ductal dilatation. No peripancreatic fluid collection. Spleen: Unremarkable. Adrenals/Urinary Tract: --Adrenal glands: Unremarkable. --Right kidney/ureter: No hydronephrosis or radiopaque kidney stones. --Left kidney/ureter: No hydronephrosis or radiopaque kidney stones. --Urinary bladder: Unremarkable. Stomach/Bowel: --Stomach/Duodenum: No hiatal hernia or other gastric abnormality. Normal duodenal course and caliber. --Small bowel: Unremarkable. --Colon: Unremarkable. --Appendix: Normal. Vascular/Lymphatic: Normal course and caliber of the major abdominal vessels. --No retroperitoneal lymphadenopathy. --No mesenteric lymphadenopathy. --No pelvic or inguinal lymphadenopathy. Reproductive: The uterus is enlarged with multiple fibroids. Other: There is a small volume of pelvic free fluid which is likely physiologic. No free air. The abdominal wall is normal. Musculoskeletal. No acute displaced fractures. IMPRESSION: 1. Findings concerning for acute cholecystitis with gallstones at the level of the cystic duct. There is intrahepatic and extrahepatic biliary ductal dilatation which raises suspicion for choledocholithiasis. Consider further evaluation by ultrasound as clinically indicated. 2. Trace right-sided pleural effusion with adjacent atelectasis. 3. Fibroid uterus. 4. Small volume of pelvic free fluid is likely physiologic. Electronically  Signed   By: Katherine Mantle M.D.   On: 10/25/2019 20:06   US Abdomen Limited RUQ (LIVER/GB)  Result Date: 10/25/2019 CLINICAL DATA:  Epigastric pain with nausea and vomiting. Abnormal CT scan today. Positive Murphy's. EXAM: ULTRASOUND ABDOMEN LIMITED RIGHT UPPER QUADRANT COMPARISON:  Abdominopelvic CT earlier today. FINDINGS: Gallbladder: Distended containing intraluminal gallstones as well as a 1 cm shadowing stone in the cystic duct. There is diffuse gallbladder wall thickening at 4 mm. Small amount of pericholecystic fluid. Positive sonographic Eulah Pont sign was noted by the sonographer. Common bile duct: Diameter: Dilated at 8 mm.  No demonstrated choledocholithiasis. Liver: Cyst in the left lobe of the liver on CT is not well seen by ultrasound. There is central intrahepatic biliary ductal dilatation. Portal vein is patent on color Doppler imaging with normal direction of blood flow towards the liver. Other: None. IMPRESSION: 1. Sonographic findings consistent with acute cholecystitis. Distended gallbladder with intraluminal gallstones, 1 cm stone in the cystic duct, gallbladder wall thickening, pericholecystic fluid, and positive sonographic Murphy sign. 2. Intrahepatic and extrahepatic biliary ductal dilatation, common bile duct measures 8 mm. No demonstrated choledocholithiasis by ultrasound. Electronically Signed   By: Narda Rutherford M.D.   On: 10/25/2019 21:05     A/P: Amber Gardner is an 54 y.o. female with acute cholecystitis.  Will admit to floor, IV abx, plan for OR later today.  Vanita Panda, MD  Colorectal and General Surgery [facility]

## 2019-10-26 ENCOUNTER — Observation Stay (HOSPITAL_COMMUNITY): Payer: BLUE CROSS/BLUE SHIELD | Admitting: Certified Registered"

## 2019-10-26 ENCOUNTER — Encounter (HOSPITAL_COMMUNITY)
Admission: EM | Disposition: A | Discharge status: Home / Self Care | Payer: Self-pay | Source: Home / Self Care | Attending: Emergency Medicine

## 2019-10-26 ENCOUNTER — Encounter (HOSPITAL_COMMUNITY): Payer: Self-pay

## 2019-10-26 ENCOUNTER — Observation Stay (HOSPITAL_COMMUNITY): Payer: BLUE CROSS/BLUE SHIELD

## 2019-10-26 HISTORY — PX: CHOLECYSTECTOMY: SHX55

## 2019-10-26 LAB — MRSA PCR SCREENING: MRSA by PCR: NEGATIVE

## 2019-10-26 SURGERY — LAPAROSCOPIC CHOLECYSTECTOMY WITH INTRAOPERATIVE CHOLANGIOGRAM
Anesthesia: General | Site: Abdomen

## 2019-10-26 MED ORDER — FENTANYL CITRATE (PF) 100 MCG/2ML IJ SOLN
INTRAMUSCULAR | Status: AC
  Filled 2019-10-26: qty 2

## 2019-10-26 MED ORDER — ONDANSETRON HCL 4 MG/2ML IJ SOLN
INTRAMUSCULAR | Status: DC | PRN
  Administered 2019-10-26: 4 mg via INTRAVENOUS

## 2019-10-26 MED ORDER — SODIUM CHLORIDE 0.45 % IV SOLN: INTRAVENOUS | Status: DC

## 2019-10-26 MED ORDER — SODIUM CHLORIDE 0.9 % IV SOLN
2.0000 g | Freq: Once | INTRAVENOUS | Status: AC
  Administered 2019-10-26: 2 g via INTRAVENOUS
  Filled 2019-10-26: qty 20

## 2019-10-26 MED ORDER — PHENYLEPHRINE 40 MCG/ML (10ML) SYRINGE FOR IV PUSH (FOR BLOOD PRESSURE SUPPORT)
PREFILLED_SYRINGE | INTRAVENOUS | Status: DC | PRN
  Administered 2019-10-26 (×3): 80 ug via INTRAVENOUS
  Administered 2019-10-26: 40 ug via INTRAVENOUS
  Administered 2019-10-26: 80 ug via INTRAVENOUS

## 2019-10-26 MED ORDER — SUGAMMADEX SODIUM 200 MG/2ML IV SOLN
INTRAVENOUS | Status: DC | PRN
  Administered 2019-10-26: 150 mg via INTRAVENOUS

## 2019-10-26 MED ORDER — PROPOFOL 10 MG/ML IV BOLUS
INTRAVENOUS | Status: AC
  Filled 2019-10-26: qty 40

## 2019-10-26 MED ORDER — ACETAMINOPHEN 650 MG RE SUPP
650.0000 mg | Freq: Four times a day (QID) | RECTAL | Status: DC | PRN
  Filled 2019-10-26: qty 1

## 2019-10-26 MED ORDER — PROMETHAZINE HCL 25 MG/ML IJ SOLN: 6.2500 mg | INTRAMUSCULAR | Status: DC | PRN

## 2019-10-26 MED ORDER — LACTATED RINGERS IR SOLN
Status: DC | PRN
  Administered 2019-10-26: 1000 mL

## 2019-10-26 MED ORDER — DEXAMETHASONE SODIUM PHOSPHATE 10 MG/ML IJ SOLN
INTRAMUSCULAR | Status: DC | PRN
  Administered 2019-10-26: 4 mg via INTRAVENOUS

## 2019-10-26 MED ORDER — ONDANSETRON HCL 4 MG/2ML IJ SOLN: 4.0000 mg | Freq: Four times a day (QID) | INTRAMUSCULAR | Status: DC | PRN

## 2019-10-26 MED ORDER — BUPIVACAINE-EPINEPHRINE 0.5% -1:200000 IJ SOLN
INTRAMUSCULAR | Status: DC | PRN
  Administered 2019-10-26: 30 mL

## 2019-10-26 MED ORDER — ACETAMINOPHEN 325 MG PO TABS
650.0000 mg | ORAL_TABLET | Freq: Four times a day (QID) | ORAL | Status: DC | PRN
  Administered 2019-10-27: 650 mg via ORAL
  Filled 2019-10-26: qty 2

## 2019-10-26 MED ORDER — LIDOCAINE 2% (20 MG/ML) 5 ML SYRINGE
INTRAMUSCULAR | Status: AC
  Filled 2019-10-26: qty 5

## 2019-10-26 MED ORDER — PROPOFOL 10 MG/ML IV BOLUS
INTRAVENOUS | Status: DC | PRN
  Administered 2019-10-26: 150 mg via INTRAVENOUS

## 2019-10-26 MED ORDER — CEFAZOLIN SODIUM-DEXTROSE 2-4 GM/100ML-% IV SOLN
INTRAVENOUS | Status: AC
  Filled 2019-10-26: qty 100

## 2019-10-26 MED ORDER — ENOXAPARIN SODIUM 40 MG/0.4ML ~~LOC~~ SOLN: 40.0000 mg | SUBCUTANEOUS | Status: DC

## 2019-10-26 MED ORDER — ACETAMINOPHEN 500 MG PO TABS
1000.0000 mg | ORAL_TABLET | ORAL | Status: AC
  Administered 2019-10-26: 1000 mg via ORAL
  Filled 2019-10-26: qty 2

## 2019-10-26 MED ORDER — FENTANYL CITRATE (PF) 100 MCG/2ML IJ SOLN
INTRAMUSCULAR | Status: DC | PRN
Start: 2019-10-26 — End: 2019-10-26
  Administered 2019-10-26 (×2): 50 ug via INTRAVENOUS
  Administered 2019-10-26: 100 ug via INTRAVENOUS

## 2019-10-26 MED ORDER — IOHEXOL 300 MG/ML  SOLN
INTRAMUSCULAR | Status: DC | PRN
  Administered 2019-10-26: 3.5 mL

## 2019-10-26 MED ORDER — BUPIVACAINE-EPINEPHRINE (PF) 0.5% -1:200000 IJ SOLN
INTRAMUSCULAR | Status: AC
  Filled 2019-10-26: qty 30

## 2019-10-26 MED ORDER — ROCURONIUM BROMIDE 10 MG/ML (PF) SYRINGE
PREFILLED_SYRINGE | INTRAVENOUS | Status: DC | PRN
  Administered 2019-10-26: 40 mg via INTRAVENOUS
  Administered 2019-10-26: 10 mg via INTRAVENOUS

## 2019-10-26 MED ORDER — ONDANSETRON HCL 4 MG/2ML IJ SOLN
INTRAMUSCULAR | Status: AC
  Filled 2019-10-26: qty 2

## 2019-10-26 MED ORDER — KETOROLAC TROMETHAMINE 30 MG/ML IJ SOLN: 30.0000 mg | Freq: Once | INTRAMUSCULAR | Status: DC

## 2019-10-26 MED ORDER — ACETAMINOPHEN 325 MG PO TABS
650.0000 mg | ORAL_TABLET | ORAL | Status: DC | PRN
  Administered 2019-10-26: 650 mg via ORAL
  Filled 2019-10-26: qty 2

## 2019-10-26 MED ORDER — FENTANYL CITRATE (PF) 100 MCG/2ML IJ SOLN: 25.0000 ug | INTRAMUSCULAR | Status: DC | PRN

## 2019-10-26 MED ORDER — 0.9 % SODIUM CHLORIDE (POUR BTL) OPTIME
TOPICAL | Status: DC | PRN
  Administered 2019-10-26: 1000 mL

## 2019-10-26 MED ORDER — LIDOCAINE 2% (20 MG/ML) 5 ML SYRINGE
INTRAMUSCULAR | Status: DC | PRN
  Administered 2019-10-26: 50 mg via INTRAVENOUS

## 2019-10-26 MED ORDER — MIDAZOLAM HCL 2 MG/2ML IJ SOLN
INTRAMUSCULAR | Status: DC | PRN
  Administered 2019-10-26: 2 mg via INTRAVENOUS

## 2019-10-26 MED ORDER — OXYCODONE HCL 5 MG/5ML PO SOLN: 5.0000 mg | Freq: Once | ORAL | Status: DC | PRN

## 2019-10-26 MED ORDER — MIDAZOLAM HCL 2 MG/2ML IJ SOLN
INTRAMUSCULAR | Status: AC
  Filled 2019-10-26: qty 2

## 2019-10-26 MED ORDER — GABAPENTIN 300 MG PO CAPS
300.0000 mg | ORAL_CAPSULE | ORAL | Status: AC
  Administered 2019-10-26: 300 mg via ORAL
  Filled 2019-10-26: qty 1

## 2019-10-26 MED ORDER — OXYCODONE HCL 5 MG PO TABS: 5.0000 mg | ORAL_TABLET | ORAL | Status: DC | PRN

## 2019-10-26 MED ORDER — DEXAMETHASONE SODIUM PHOSPHATE 10 MG/ML IJ SOLN
INTRAMUSCULAR | Status: AC
  Filled 2019-10-26: qty 1

## 2019-10-26 MED ORDER — HYDROMORPHONE HCL 1 MG/ML IJ SOLN
1.0000 mg | INTRAMUSCULAR | Status: DC | PRN
  Administered 2019-10-26: 1 mg via INTRAVENOUS
  Filled 2019-10-26: qty 1

## 2019-10-26 MED ORDER — ROCURONIUM BROMIDE 10 MG/ML (PF) SYRINGE
PREFILLED_SYRINGE | INTRAVENOUS | Status: AC
  Filled 2019-10-26: qty 10

## 2019-10-26 MED ORDER — LACTATED RINGERS IV SOLN: INTRAVENOUS | Status: DC

## 2019-10-26 MED ORDER — CHLORHEXIDINE GLUCONATE 0.12 % MT SOLN
15.0000 mL | Freq: Once | OROMUCOSAL | Status: AC
  Administered 2019-10-26: 15 mL via OROMUCOSAL

## 2019-10-26 MED ORDER — PHENYLEPHRINE 40 MCG/ML (10ML) SYRINGE FOR IV PUSH (FOR BLOOD PRESSURE SUPPORT)
PREFILLED_SYRINGE | INTRAVENOUS | Status: AC
  Filled 2019-10-26: qty 10

## 2019-10-26 MED ORDER — OXYCODONE HCL 5 MG PO TABS: 5.0000 mg | ORAL_TABLET | Freq: Once | ORAL | Status: DC | PRN

## 2019-10-26 MED ORDER — ONDANSETRON 4 MG PO TBDP: 4.0000 mg | ORAL_TABLET | Freq: Four times a day (QID) | ORAL | Status: DC | PRN

## 2019-10-26 MED ORDER — TRAMADOL HCL 50 MG PO TABS
50.0000 mg | ORAL_TABLET | Freq: Four times a day (QID) | ORAL | Status: DC | PRN
  Administered 2019-10-26 – 2019-10-27 (×2): 50 mg via ORAL
  Filled 2019-10-26 (×2): qty 1

## 2019-10-26 SURGICAL SUPPLY — 31 items
APPLIER CLIP ROT 10 11.4 M/L (STAPLE) ×3
CABLE HIGH FREQUENCY MONO STRZ (ELECTRODE) ×3 IMPLANT
CHLORAPREP W/TINT 26 (MISCELLANEOUS) ×6 IMPLANT
CLIP APPLIE ROT 10 11.4 M/L (STAPLE) ×1 IMPLANT
COVER MAYO STAND STRL (DRAPES) ×3 IMPLANT
COVER SURGICAL LIGHT HANDLE (MISCELLANEOUS) ×3 IMPLANT
COVER WAND RF STERILE (DRAPES) IMPLANT
DECANTER SPIKE VIAL GLASS SM (MISCELLANEOUS) ×3 IMPLANT
DERMABOND ADVANCED (GAUZE/BANDAGES/DRESSINGS) ×2
DERMABOND ADVANCED .7 DNX12 (GAUZE/BANDAGES/DRESSINGS) ×1 IMPLANT
DRAPE C-ARM 42X120 X-RAY (DRAPES) ×3 IMPLANT
ELECT REM PT RETURN 15FT ADLT (MISCELLANEOUS) ×3 IMPLANT
GLOVE SURG ORTHO 8.0 STRL STRW (GLOVE) ×3 IMPLANT
GOWN STRL REUS W/TWL XL LVL3 (GOWN DISPOSABLE) ×6 IMPLANT
HEMOSTAT SURGICEL 4X8 (HEMOSTASIS) IMPLANT
KIT BASIN OR (CUSTOM PROCEDURE TRAY) ×3 IMPLANT
KIT TURNOVER KIT A (KITS) ×3 IMPLANT
PENCIL SMOKE EVACUATOR (MISCELLANEOUS) IMPLANT
POUCH SPECIMEN RETRIEVAL 10MM (ENDOMECHANICALS) ×3 IMPLANT
SCISSORS LAP 5X35 DISP (ENDOMECHANICALS) ×3 IMPLANT
SET CHOLANGIOGRAPH MIX (MISCELLANEOUS) ×3 IMPLANT
SET IRRIG TUBING LAPAROSCOPIC (IRRIGATION / IRRIGATOR) ×3 IMPLANT
SET TUBE SMOKE EVAC HIGH FLOW (TUBING) ×3 IMPLANT
SLEEVE XCEL OPT CAN 5 100 (ENDOMECHANICALS) ×3 IMPLANT
SUT MNCRL AB 4-0 PS2 18 (SUTURE) ×3 IMPLANT
TOWEL OR 17X26 10 PK STRL BLUE (TOWEL DISPOSABLE) ×3 IMPLANT
TOWEL OR NON WOVEN STRL DISP B (DISPOSABLE) ×3 IMPLANT
TRAY LAPAROSCOPIC (CUSTOM PROCEDURE TRAY) ×3 IMPLANT
TROCAR BLADELESS OPT 5 100 (ENDOMECHANICALS) ×3 IMPLANT
TROCAR XCEL BLUNT TIP 100MML (ENDOMECHANICALS) ×3 IMPLANT
TROCAR XCEL NON-BLD 11X100MML (ENDOMECHANICALS) ×3 IMPLANT

## 2019-10-26 NOTE — Progress Notes (Addendum)
Ice packs placed under patients arms for fever and extra covers removed. Telephone order given by Dr. Clovis Pu from [facility] to administer Tylenol 625mg  every 4 hours PRN for fever/mild pain. Tylenol was given to patient by mouth with a small sip of water. Will continue to monitor the patient.

## 2019-10-26 NOTE — Progress Notes (Signed)
Patient in yellow mews at start of shift 

## 2019-10-26 NOTE — Progress Notes (Signed)
Patient returns to room 1539 at this time from PACU

## 2019-10-26 NOTE — Anesthesia Procedure Notes (Signed)
Procedure Name: Intubation Date/Time: 10/26/2019 12:31 PM Performed by: Eben Burow, CRNA Pre-anesthesia Checklist: Patient identified, Emergency Drugs available, Suction available, Patient being monitored and Timeout performed Patient Re-evaluated:Patient Re-evaluated prior to induction Oxygen Delivery Method: Circle system utilized Preoxygenation: Pre-oxygenation with 100% oxygen Induction Type: IV induction Ventilation: Mask ventilation without difficulty Laryngoscope Size: Mac and 4 Grade View: Grade I Tube type: Oral Number of attempts: 1 Airway Equipment and Method: Stylet Placement Confirmation: ETT inserted through vocal cords under direct vision,  positive ETCO2 and breath sounds checked- equal and bilateral Secured at: 21 cm Tube secured with: Tape Dental Injury: Teeth and Oropharynx as per pre-operative assessment

## 2019-10-26 NOTE — Progress Notes (Signed)
Patient's daughter Harnden informed that patient will be going to the OR at 1145.  Puskas states she may be contacted to interpret for her mother to sign her surgical consent.  Lanie's phone number is [phone]

## 2019-10-26 NOTE — Progress Notes (Signed)
   10/26/19 0723  Assess: MEWS Score  Temp (!) 101.5 F (38.6 C)  BP 135/85  Pulse Rate (!) 101  Resp 18  SpO2 98 %  O2 Device Room Air  Assess: MEWS Score  MEWS Temp 2  MEWS Systolic 0  MEWS Pulse 1  MEWS RR 0  MEWS LOC 0  MEWS Score 3  MEWS Score Color Yellow  Assess: if the MEWS score is Yellow or Red  Were vital signs taken at a resting state? Yes  Focused Assessment No change from prior assessment  Early Detection of Sepsis Score *See Row Information* High  MEWS guidelines implemented *See Row Information* Yes  Treat  MEWS Interventions Escalated (See documentation below)  Pain Scale 0-10  Pain Score 6  Pain Type Acute pain  Pain Location Abdomen  Pain Orientation Right  Pain Frequency Intermittent  Pain Onset On-going  Take Vital Signs  Increase Vital Sign Frequency  Yellow: Q 2hr X 2 then Q 4hr X 2, if remains yellow, continue Q 4hrs  Escalate  MEWS: Escalate Yellow: discuss with charge nurse/RN and consider discussing with provider and RRT  Notify: Charge Nurse/RN  Name of Charge Nurse/RN Notified [employer] officer, [employer]  Date Charge Nurse/RN Notified 10/26/19  Time Charge Nurse/RN Notified 0730  Document  Progress note created (see row info) Yes  patient in yellow mews protocol due to elevated temp.  Will continue to monitor patient

## 2019-10-26 NOTE — Transfer of Care (Signed)
Immediate Anesthesia Transfer of Care Note  Patient: Amber Gardner  Procedure(s) Performed: LAPAROSCOPIC CHOLECYSTECTOMY WITH INTRAOPERATIVE CHOLANGIOGRAM (N/A Abdomen)  Patient Location: PACU  Anesthesia Type:General  Level of Consciousness: awake and patient cooperative  Airway & Oxygen Therapy: Patient Spontanous Breathing and Patient connected to face mask oxygen  Post-op Assessment: Report given to RN and Post -op Vital signs reviewed and stable  Post vital signs: stable  Last Vitals:  Vitals Value Taken Time  BP 116/63 10/26/19 1403  Temp    Pulse 99 10/26/19 1406  Resp 18 10/26/19 1406  SpO2 100 % 10/26/19 1406  Vitals shown include unvalidated device data.  Last Pain:  Vitals:   10/26/19 1202  TempSrc:   PainSc: 0-No pain         Complications: No complications documented.

## 2019-10-26 NOTE — Discharge Instructions (Signed)
[facility] ° °Please arrive at least 30 min before your appointment to complete your check in paperwork.  If you are unable to arrive 30 min prior to your appointment time we may have to cancel or reschedule you. °LAPAROSCOPIC SURGERY: POST OP INSTRUCTIONS °Always review your discharge instruction sheet given to you by the facility where your surgery was performed. °IF YOU HAVE DISABILITY OR FAMILY LEAVE FORMS, YOU MUST BRING THEM TO THE OFFICE FOR PROCESSING.   °DO NOT GIVE THEM TO YOUR DOCTOR. ° °PAIN CONTROL ° °1. First take acetaminophen (Tylenol) AND/or ibuprofen (Advil) to control your pain after surgery.  Follow directions on package.  Taking acetaminophen (Tylenol) and/or ibuprofen (Advil) regularly after surgery will help to control your pain and lower the amount of prescription pain medication you may need.  You should not take more than 4,000 mg (4 grams) of acetaminophen (Tylenol) in 24 hours.  You should not take ibuprofen (Advil), aleve, motrin, naprosyn or other NSAIDS if you have a history of stomach ulcers or chronic kidney disease.  °2. A prescription for pain medication may be given to you upon discharge.  Take your pain medication as prescribed, if you still have uncontrolled pain after taking acetaminophen (Tylenol) or ibuprofen (Advil). °3. Use ice packs to help control pain. °4. If you need a refill on your pain medication, please contact your pharmacy.  They will contact our office to request authorization. Prescriptions will not be filled after 5pm or on week-ends. ° °HOME MEDICATIONS °5. Take your usually prescribed medications unless otherwise directed. ° °DIET °6. You should follow a light diet the first few days after arrival home.  Be sure to include lots of fluids daily. Avoid fatty, fried foods.  ° °CONSTIPATION °7. It is common to experience some constipation after surgery and if you are taking pain medication.  Increasing fluid intake and taking a stool  softener (such as Colace) will usually help or prevent this problem from occurring.  A mild laxative (Milk of Magnesia or Miralax) should be taken according to package instructions if there are no bowel movements after 48 hours. ° °WOUND/INCISION CARE °8. Most patients will experience some swelling and bruising in the area of the incisions.  Ice packs will help.  Swelling and bruising can take several days to resolve.  °9. Unless discharge instructions indicate otherwise, follow guidelines below  °a. STERI-STRIPS - you may remove your outer bandages 48 hours after surgery, and you may shower at that time.  You have steri-strips (small skin tapes) in place directly over the incision.  These strips should be left on the skin for 7-10 days.   °b. DERMABOND/SKIN GLUE - you may shower in 24 hours.  The glue will flake off over the next 2-3 weeks. °10. Any sutures or staples will be removed at the office during your follow-up visit. ° °ACTIVITIES °11. You may resume regular (light) daily activities beginning the next day--such as daily self-care, walking, climbing stairs--gradually increasing activities as tolerated.  You may have sexual intercourse when it is comfortable.  Refrain from any heavy lifting or straining until approved by your doctor. °a. You may drive when you are no longer taking prescription pain medication, you can comfortably wear a seatbelt, and you can safely maneuver your car and apply brakes. ° °FOLLOW-UP °12. You should see your doctor in the office for a follow-up appointment approximately 2-3 weeks after your surgery.  You should have been given your post-op/follow-up appointment when   your surgery was scheduled.  If you did not receive a post-op/follow-up appointment, make sure that you call for this appointment within a day or two after you arrive home to insure a convenient appointment time. ° °OTHER INSTRUCTIONS ° °WHEN TO CALL YOUR DOCTOR: °1. Fever over 101.0 °2. Inability to  urinate °3. Continued bleeding from incision. °4. Increased pain, redness, or drainage from the incision. °5. Increasing abdominal pain ° °The clinic staff is available to answer your questions during regular business hours.  Please don’t hesitate to call and ask to speak to one of the nurses for clinical concerns.  If you have a medical emergency, go to the nearest emergency room or call 911.  A surgeon from [facility] is always on call at the hospital. °[address], [address], [place], [place]  [address] ? [address] [address], [place],    [address] °[phone] ? [phone] ? FAX [phone] ° ° ° °

## 2019-10-26 NOTE — Plan of Care (Signed)
Alert and oriented. Surgical pain managed by prn tramadol and dilaudid. Cough noted. Clear liquid diet tolerated, full liquid diet ordered for breakfast. Able to ambulate to the bathroom. SCD in placed, removed for 30 mins. Call bell within reach.  Problem: Education: Goal: Required Educational Video(s) Outcome: Progressing   Problem: Clinical Measurements: Goal: Postoperative complications will be avoided or minimized Outcome: Progressing   Problem: Skin Integrity: Goal: Demonstration of wound healing without infection will improve Outcome: Progressing   Problem: Education: Goal: Knowledge of General Education information will improve Description: Including pain rating scale, medication(s)/side effects and non-pharmacologic comfort measures Outcome: Progressing   Problem: Health Behavior/Discharge Planning: Goal: Ability to manage health-related needs will improve Outcome: Progressing   Problem: Clinical Measurements: Goal: Ability to maintain clinical measurements within normal limits will improve Outcome: Progressing Goal: Will remain free from infection Outcome: Progressing Goal: Diagnostic test results will improve Outcome: Progressing Goal: Respiratory complications will improve Outcome: Progressing Goal: Cardiovascular complication will be avoided Outcome: Progressing   Problem: Activity: Goal: Risk for activity intolerance will decrease Outcome: Progressing   Problem: Nutrition: Goal: Adequate nutrition will be maintained Outcome: Progressing   Problem: Coping: Goal: Level of anxiety will decrease Outcome: Progressing   Problem: Elimination: Goal: Will not experience complications related to bowel motility Outcome: Progressing Goal: Will not experience complications related to urinary retention Outcome: Progressing   Problem: Pain Managment: Goal: General experience of comfort will improve Outcome: Progressing   Problem: Safety: Goal: Ability to  remain free from injury will improve Outcome: Progressing   Problem: Skin Integrity: Goal: Risk for impaired skin integrity will decrease Outcome: Progressing

## 2019-10-26 NOTE — Op Note (Signed)
Procedure Note  Pre-operative Diagnosis:  Acute cholecystitis, cholelithiasis  Post-operative Diagnosis:  same  Surgeon:  Darnell Level, MD  Assistant:  none   Procedure:  Laparoscopic cholecystectomy with intra-operative cholangiography  Anesthesia:  General  Estimated Blood Loss:  minimal  Drains: none         Specimen: gallbladder to pathology  Indications:  Amber Gardner is an 54 y.o. female who is here for epigastric pain with nausea and vomiting.  She has had this pain for 3 days.  No fevers.  CT abd and USN demonstrate acute cholecystitis with cholelithiasis.  Patient now comes to surgery for cholecystectomy.  Procedure Details:  The patient was seen in the pre-op holding area. The risks, benefits, complications, treatment options, and expected outcomes were previously discussed with the patient. The patient agreed with the proposed plan and has signed the informed consent form.  The patient was transported to operating room #2 at the [facility]. The patient was placed in the supine position on the operating room table. Following induction of general anesthesia, the abdomen was prepped and draped in the usual aseptic fashion.  An incision was made in the skin near the umbilicus. The midline fascia was incised and the peritoneal cavity was entered and a Hasson cannula was introduced under direct vision. The cannula was secured with a 0-Vicryl pursestring suture. Pneumoperitoneum was established with carbon dioxide. Additional cannulae were introduced under direct vision along the right costal margin in the midline, mid-clavicular line, and anterior axillary line.   The gallbladder was identified.  It was acutely inflamed and thick walled and distended.  It was aspirated and then the fundus grasped and retracted cephalad. Adhesions were taken down bluntly and the electrocautery was utilized as needed, taking care not to involve any adjacent structures. The infundibulum was grasped  and retracted laterally, exposing the peritoneum overlying the triangle of Calot. The peritoneum was incised and structures exposed with blunt dissection. The cystic duct was clearly identified, bluntly dissected circumferentially, and clipped at the neck of the gallbladder.  An incision was made in the cystic duct and the cholangiogram catheter introduced. The catheter was secured using an ligaclip.  Real-time cholangiography was performed using C-arm fluoroscopy.  There was rapid filling of a normal caliber common bile duct.  There was reflux of contrast into the left and right hepatic ductal systems.  There was free flow distally into the duodenum without filling defect or obstruction.  The catheter was removed from the peritoneal cavity.  The cystic duct was then ligated with ligaclips and divided. The cystic artery was identified, dissected circumferentially, ligated with ligaclips, and divided.  The gallbladder was dissected away from the gallbladder bed using the electrocautery for hemostasis. The gallbladder was completely removed from the liver and placed into an endocatch bag. The gallbladder was removed in the endocatch bag through the umbilical port site and submitted to pathology for review.  The right upper quadrant was irrigated and the gallbladder bed was inspected. Stones were extracted with the stone scoop.  Hemostasis was achieved with the electrocautery.  Cannulae were removed under direct vision and good hemostasis was noted. Pneumoperitoneum was released and the majority of the carbon dioxide evacuated. The umbilical wound was irrigated and the fascia was then closed with the pursestring suture.  Local anesthetic was infiltrated at all port sites. Skin incisions were closed with 4-0 Monocril subcuticular sutures and Dermabond was applied.  Instrument, sponge, and needle counts were correct at the conclusion of the  case.  The patient was awakened from anesthesia and brought to the  recovery room in stable condition.  The patient tolerated the procedure well.   Darnell Level, MD [facility] Office: [phone]

## 2019-10-26 NOTE — Anesthesia Postprocedure Evaluation (Signed)
Anesthesia Post Note  Patient: Amber Gardner  Procedure(s) Performed: LAPAROSCOPIC CHOLECYSTECTOMY WITH INTRAOPERATIVE CHOLANGIOGRAM (N/A Abdomen)     Patient location during evaluation: PACU Anesthesia Type: General Level of consciousness: awake and alert Pain management: pain level controlled Vital Signs Assessment: post-procedure vital signs reviewed and stable Respiratory status: spontaneous breathing, nonlabored ventilation, respiratory function stable and patient connected to nasal cannula oxygen Cardiovascular status: blood pressure returned to baseline and stable Postop Assessment: no apparent nausea or vomiting Anesthetic complications: no   No complications documented.  Last Vitals:  Vitals:   10/26/19 1515 10/26/19 1549  BP: 115/69 124/77  Pulse: 89 87  Resp: 13 16  Temp: 36.8 C 36.8 C  SpO2: 97% 95%    Last Pain:  Vitals:   10/26/19 1549  TempSrc: Oral  PainSc:                  Leahanna Buser P Genean Adamski

## 2019-10-26 NOTE — Progress Notes (Signed)
Patients temp 100.2, no PRN to administer. On-call attending notified via AMION, awaiting response and orders. Will continue to monitor the patient.

## 2019-10-26 NOTE — Anesthesia Preprocedure Evaluation (Addendum)
Anesthesia Evaluation  Patient identified by MRN, date of birth, ID band Patient awake    Reviewed: Allergy & Precautions, NPO status , Patient's Chart, lab work & pertinent test results  History of Anesthesia Complications Negative for: history of anesthetic complications  Airway Mallampati: III  TM Distance: >3 FB Neck ROM: Full    Dental  (+) Missing, Partial Upper   Pulmonary neg pulmonary ROS,    Pulmonary exam normal breath sounds clear to auscultation       Cardiovascular negative cardio ROS Normal cardiovascular exam Rhythm:Regular Rate:Normal     Neuro/Psych negative neurological ROS  negative psych ROS   GI/Hepatic negative GI ROS, Neg liver ROS,   Endo/Other   Hyponatremia, Na 132   Renal/GU negative Renal ROS     Musculoskeletal negative musculoskeletal ROS (+)   Abdominal   Peds  Hematology  (+) anemia ,   Anesthesia Other Findings Acute Cholecystitis  Reproductive/Obstetrics                            Anesthesia Physical Anesthesia Plan  ASA: II  Anesthesia Plan: General   Post-op Pain Management:    Induction: Intravenous  PONV Risk Score and Plan: 4 or greater and Treatment may vary due to age or medical condition, Ondansetron, Dexamethasone, Midazolam and Propofol infusion  Airway Management Planned: Oral ETT  Additional Equipment: None  Intra-op Plan:   Post-operative Plan: Extubation in OR  Informed Consent: I have reviewed the patients History and Physical, chart, labs and discussed the procedure including the risks, benefits and alternatives for the proposed anesthesia with the patient or authorized representative who has indicated his/her understanding and acceptance.     Dental advisory given  Plan Discussed with: CRNA and Anesthesiologist  Anesthesia Plan Comments:        Anesthesia Quick Evaluation

## 2019-10-26 NOTE — Interval H&P Note (Signed)
History and Physical Interval Note:  10/26/2019 12:10 PM  Amber Gardner  has presented today for surgery, with the diagnosis of Acute Cholecystitis.  The various methods of treatment have been discussed with the patient and family. After consideration of risks, benefits and other options for treatment, the patient has consented to    Procedure(s): LAPAROSCOPIC CHOLECYSTECTOMY WITH INTRAOPERATIVE CHOLANGIOGRAM (N/A) as a surgical intervention.    The patient's history has been reviewed, patient examined, no change in status, stable for surgery.  I have reviewed the patient's chart and labs.  Questions were answered to the patient's satisfaction.    Darnell Level, MD [facility] Office: [phone]   Darnell Level

## 2019-10-27 ENCOUNTER — Encounter (HOSPITAL_COMMUNITY): Payer: Self-pay | Admitting: Surgery

## 2019-10-27 LAB — SURGICAL PATHOLOGY

## 2019-10-27 MED ORDER — ONDANSETRON 4 MG PO TBDP: 4.0000 mg | ORAL_TABLET | Freq: Four times a day (QID) | ORAL | 0 refills | Status: AC | PRN

## 2019-10-27 MED ORDER — TRAMADOL HCL 50 MG PO TABS: 50.0000 mg | ORAL_TABLET | Freq: Four times a day (QID) | ORAL | 0 refills | Status: AC | PRN

## 2019-10-27 NOTE — Progress Notes (Signed)
Discharge instructions and teaching reviewed with pt and boyfriend. Questions answered and acknowledged understanding.

## 2019-10-27 NOTE — Plan of Care (Signed)
  Problem: Education: Goal: Required Educational Video(s) Outcome: Adequate for Discharge   Problem: Clinical Measurements: Goal: Postoperative complications will be avoided or minimized Outcome: Adequate for Discharge   Problem: Skin Integrity: Goal: Demonstration of wound healing without infection will improve Outcome: Adequate for Discharge   Problem: Education: Goal: Knowledge of General Education information will improve Description: Including pain rating scale, medication(s)/side effects and non-pharmacologic comfort measures Outcome: Adequate for Discharge   Problem: Health Behavior/Discharge Planning: Goal: Ability to manage health-related needs will improve Outcome: Adequate for Discharge   Problem: Clinical Measurements: Goal: Ability to maintain clinical measurements within normal limits will improve Outcome: Adequate for Discharge Goal: Will remain free from infection Outcome: Adequate for Discharge Goal: Diagnostic test results will improve Outcome: Adequate for Discharge Goal: Respiratory complications will improve Outcome: Adequate for Discharge Goal: Cardiovascular complication will be avoided Outcome: Adequate for Discharge   Problem: Activity: Goal: Risk for activity intolerance will decrease Outcome: Adequate for Discharge   Problem: Nutrition: Goal: Adequate nutrition will be maintained Outcome: Adequate for Discharge   Problem: Coping: Goal: Level of anxiety will decrease Outcome: Adequate for Discharge   Problem: Elimination: Goal: Will not experience complications related to bowel motility Outcome: Adequate for Discharge Goal: Will not experience complications related to urinary retention Outcome: Adequate for Discharge   Problem: Pain Managment: Goal: General experience of comfort will improve Outcome: Adequate for Discharge   Problem: Safety: Goal: Ability to remain free from injury will improve Outcome: Adequate for Discharge    Problem: Skin Integrity: Goal: Risk for impaired skin integrity will decrease Outcome: Adequate for Discharge   

## 2019-10-27 NOTE — Progress Notes (Signed)
   10/27/19 0349  Vitals  Temp (!) 100.7 F (38.2 C)  Temp Source Oral  BP 120/80  MAP (mmHg) 93  BP Location Right Arm  BP Method Automatic  Patient Position (if appropriate) Lying  Pulse Rate (!) 104  Pulse Rate Source Monitor  Resp (!) 24  MEWS COLOR  MEWS Score Color Yellow  Oxygen Therapy  SpO2 93 %  O2 Device Room Air  MEWS Score  MEWS Temp 1  MEWS Systolic 0  MEWS Pulse 1  MEWS RR 1  MEWS LOC 0  MEWS Score 3   Mews score 3 ( Yellow ), Patient not in distress, verbalized she doesn't feel she has fever. MD Katherina Right ) notified. Tylenol prn given.

## 2019-10-27 NOTE — Discharge Summary (Signed)
[facility] Discharge Summary   Patient ID: Amber Gardner MRN: [id number] DOB/AGE: 52-13-1967 52 y.o.  Admit date: 10/25/2019 Discharge date: 10/27/2019  Admitting Diagnosis: Acute cholecystitis  Discharge Diagnosis Patient Active Problem List   Diagnosis Date Noted  . Cholecystitis 10/25/2019    Consultants None  Imaging: DG Cholangiogram Operative  Result Date: 10/26/2019 CLINICAL DATA:  54 year old female undergoing elective laparoscopic cholecystectomy with intraoperative cholangiogram EXAM: INTRAOPERATIVE CHOLANGIOGRAM TECHNIQUE: Cholangiographic images from the C-arm fluoroscopic device were submitted for interpretation post-operatively. Please see the procedural report for the amount of contrast and the fluoroscopy time utilized. COMPARISON:  Abdominal ultrasound 10/25/2019; CT scan of the abdomen and pelvis 10/25/2019 FINDINGS: A saved fluoroscopic clip is submitted for review. The images demonstrate cannulation of the cystic duct remanent and intraoperative cholangiogram. The common hepatic and common bile ducts are diffusely dilated. However, no discrete stricture, stenosis or filling defect to suggest choledocholithiasis is identified. Contrast material does pass freely through the distal common bile duct and into the duodenum. IMPRESSION: 1. Nonspecific dilation of the common hepatic and common bile ducts without evidence of stenosis, stricture or choledocholithiasis. Electronically Signed   By: Malachy Moan M.D.   On: 10/26/2019 13:36   CT ABDOMEN PELVIS W CONTRAST  Result Date: 10/25/2019 CLINICAL DATA:  Epigastric pain. EXAM: CT ABDOMEN AND PELVIS WITH CONTRAST TECHNIQUE: Multidetector CT imaging of the abdomen and pelvis was performed using the standard protocol following bolus administration of intravenous contrast. CONTRAST:  OMNIPAQUE IOHEXOL 300 MG/ML  SOLN COMPARISON:  None. FINDINGS: Lower chest: There is a trace right-sided pleural effusion with  adjacent atelectasis.The heart size is normal. Hepatobiliary: The liver is normal. There appears to be diffuse gallbladder wall thickening. The gallbladder is distended. Multiple gallstones are noted. There appear to be gallstones at the level of the cystic duct. There is intrahepatic and extrahepatic biliary ductal dilatation. Pancreas: Normal contours without ductal dilatation. No peripancreatic fluid collection. Spleen: Unremarkable. Adrenals/Urinary Tract: --Adrenal glands: Unremarkable. --Right kidney/ureter: No hydronephrosis or radiopaque kidney stones. --Left kidney/ureter: No hydronephrosis or radiopaque kidney stones. --Urinary bladder: Unremarkable. Stomach/Bowel: --Stomach/Duodenum: No hiatal hernia or other gastric abnormality. Normal duodenal course and caliber. --Small bowel: Unremarkable. --Colon: Unremarkable. --Appendix: Normal. Vascular/Lymphatic: Normal course and caliber of the major abdominal vessels. --No retroperitoneal lymphadenopathy. --No mesenteric lymphadenopathy. --No pelvic or inguinal lymphadenopathy. Reproductive: The uterus is enlarged with multiple fibroids. Other: There is a small volume of pelvic free fluid which is likely physiologic. No free air. The abdominal wall is normal. Musculoskeletal. No acute displaced fractures. IMPRESSION: 1. Findings concerning for acute cholecystitis with gallstones at the level of the cystic duct. There is intrahepatic and extrahepatic biliary ductal dilatation which raises suspicion for choledocholithiasis. Consider further evaluation by ultrasound as clinically indicated. 2. Trace right-sided pleural effusion with adjacent atelectasis. 3. Fibroid uterus. 4. Small volume of pelvic free fluid is likely physiologic. Electronically Signed   By: Katherine Mantle M.D.   On: 10/25/2019 20:06   US Abdomen Limited RUQ (LIVER/GB)  Result Date: 10/25/2019 CLINICAL DATA:  Epigastric pain with nausea and vomiting. Abnormal CT scan today. Positive  Murphy's. EXAM: ULTRASOUND ABDOMEN LIMITED RIGHT UPPER QUADRANT COMPARISON:  Abdominopelvic CT earlier today. FINDINGS: Gallbladder: Distended containing intraluminal gallstones as well as a 1 cm shadowing stone in the cystic duct. There is diffuse gallbladder wall thickening at 4 mm. Small amount of pericholecystic fluid. Positive sonographic Eulah Pont sign was noted by the sonographer. Common bile duct: Diameter: Dilated at 8 mm.  No demonstrated choledocholithiasis.  Liver: Cyst in the left lobe of the liver on CT is not well seen by ultrasound. There is central intrahepatic biliary ductal dilatation. Portal vein is patent on color Doppler imaging with normal direction of blood flow towards the liver. Other: None. IMPRESSION: 1. Sonographic findings consistent with acute cholecystitis. Distended gallbladder with intraluminal gallstones, 1 cm stone in the cystic duct, gallbladder wall thickening, pericholecystic fluid, and positive sonographic Murphy sign. 2. Intrahepatic and extrahepatic biliary ductal dilatation, common bile duct measures 8 mm. No demonstrated choledocholithiasis by ultrasound. Electronically Signed   By: Narda Rutherford M.D.   On: 10/25/2019 21:05    Procedures Dr. Gerrit Friends (10/26/2019) - Laparoscopic Cholecystectomy with [facility]  Hospital Course:  Amber Gardner is a 54yo female who presented to [facility] 10/24 with 3 days of epigastric pain, nausea, and vomiting.  Workup showed acute cholecystitis.  Patient was admitted, started on IV rocephin, and underwent procedure listed above.  Tolerated procedure well and was transferred to the floor.  Diet was advanced as tolerated.  On POD1, the patient was voiding well, tolerating diet, ambulating well, pain well controlled, vital signs stable, incisions c/d/i and felt stable for discharge home.  Patient will follow up as below and knows to call with questions or concerns.    I have personally reviewed the patients medication history on the [place] controlled  substance database.    Physical Exam: General:  Alert, NAD, pleasant, comfortable Cardio: RRR Pulm: CTAB, rate and effort normal Abd:  Soft, ND, appropriately tender, multiple lap incisions C/D/I, +BS  Allergies as of 10/27/2019   No Known Allergies     Medication List    TAKE these medications   acetaminophen 500 MG tablet Commonly known as: TYLENOL Take 1,000 mg by mouth every 6 (six) hours as needed for moderate pain.   multivitamin with minerals Tabs tablet Take 1 tablet by mouth daily.   ondansetron 4 MG disintegrating tablet Commonly known as: ZOFRAN-ODT Take 1 tablet (4 mg total) by mouth every 6 (six) hours as needed for nausea.   traMADol 50 MG tablet Commonly known as: ULTRAM Take 1 tablet (50 mg total) by mouth every 6 (six) hours as needed for severe pain.         Follow-up Information    [facility], [place]. Go on 11/17/2019.   Specialty: General Surgery Why: Your appointment is 11/17/19 at 9:45 am Please arrive 30 minutes prior to your appointment to check in and fill out paperwork. Bring photo ID and insurance information. Contact information: [address] [place] [address] [phone]              Signed: Franne Forts, [facility] 10/27/2019, 7:37 AM Please see Amion for pager number during day hours 7:00am-4:30pm

## 2022-03-29 IMAGING — CT CT ABD-PELV W/ CM
2 of 5 series · 16 of 46 positions shown, 18 images · IV contrast (Omnipaque)
Comparison: None.

CLINICAL DATA: Epigastric pain.

EXAM:
CT ABDOMEN AND PELVIS WITH CONTRAST
TECHNIQUE: Multidetector CT imaging of the abdomen and pelvis was performed
using the standard protocol following bolus administration of
intravenous contrast.
CONTRAST:  100mL OMNIPAQUE IOHEXOL 300 MG/ML  SOLN

[Series 3: axial st · axial · 0.68mm/px · z∈[+730,+1085]mm · 13 of 81 slices shown, 15 images]
[im 5/81  soft-tissue]
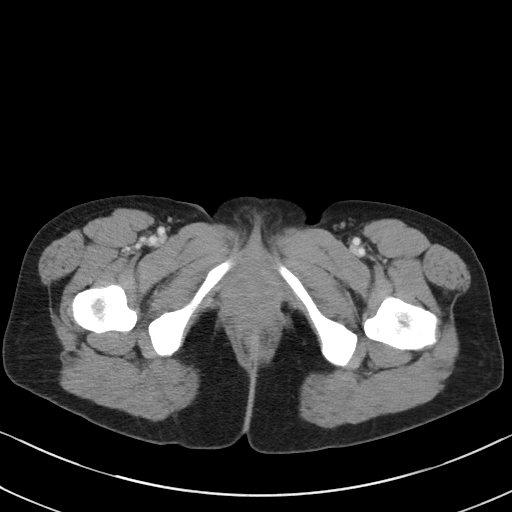
[im 5/81  bone]
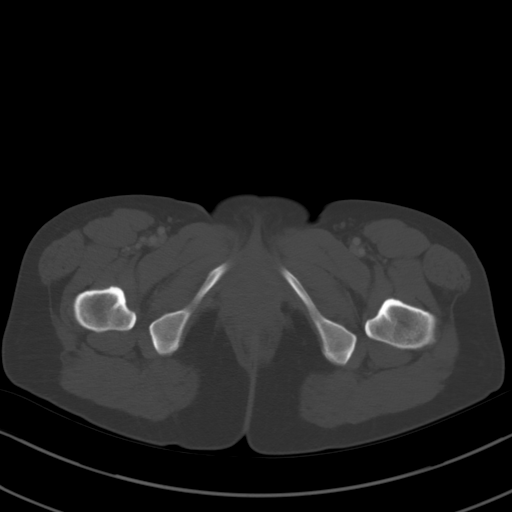
[im 13/81  soft-tissue]
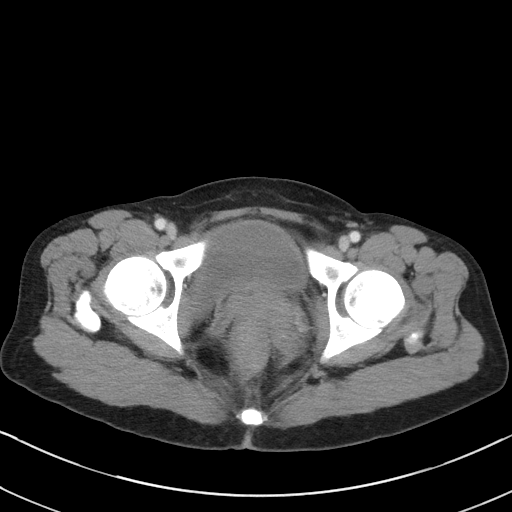
[im 17/81  soft-tissue]
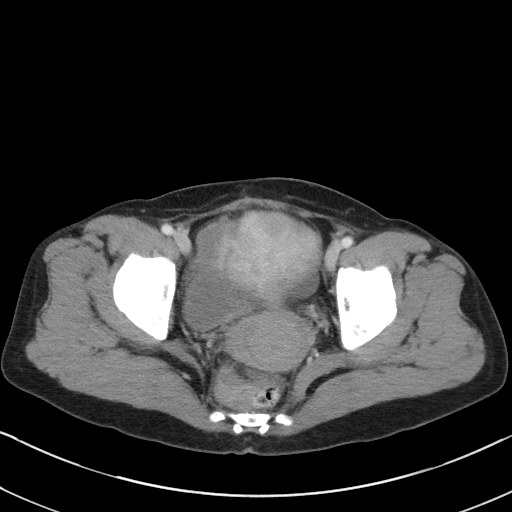
[im 22/81  soft-tissue]
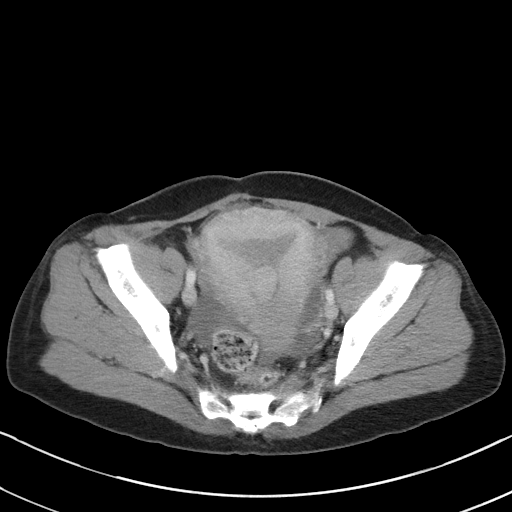
[im 30/81  soft-tissue]
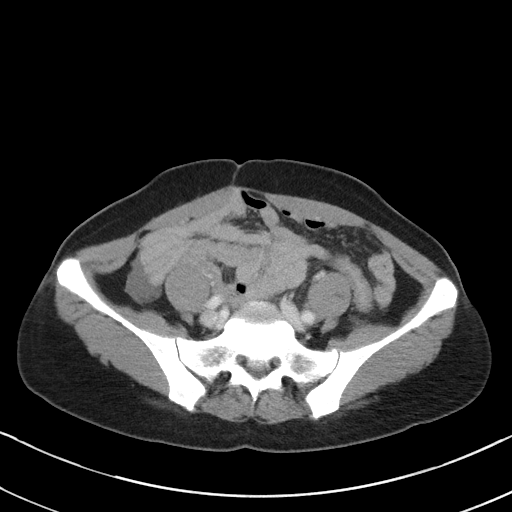
[im 34/81  soft-tissue]
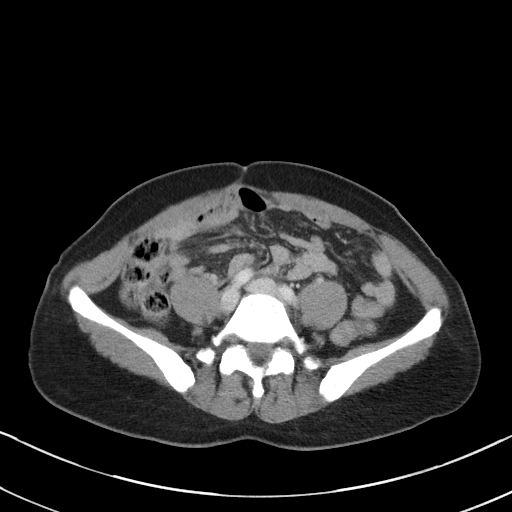
[im 43/81  soft-tissue]
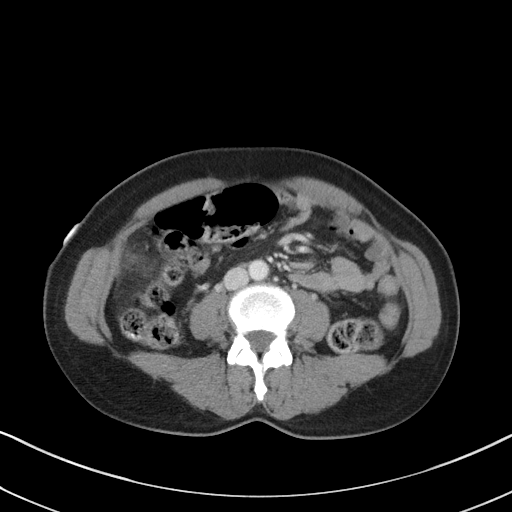
[im 47/81  soft-tissue]
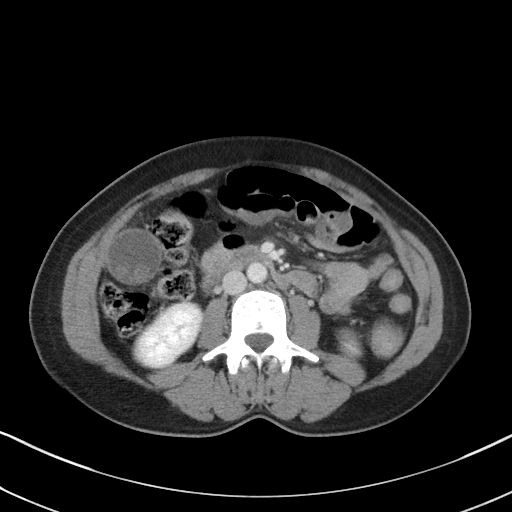
[im 51/81  soft-tissue]
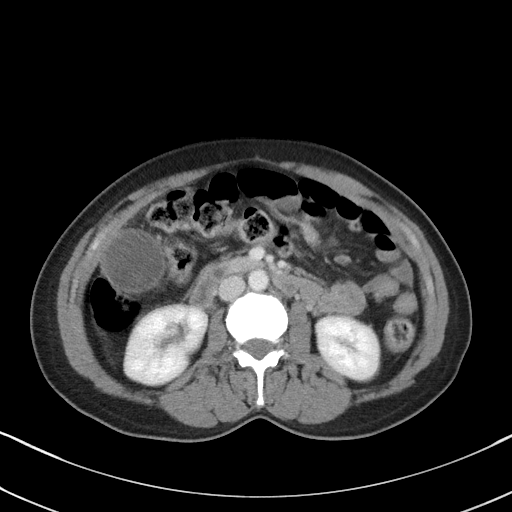
[im 51/81  bone]
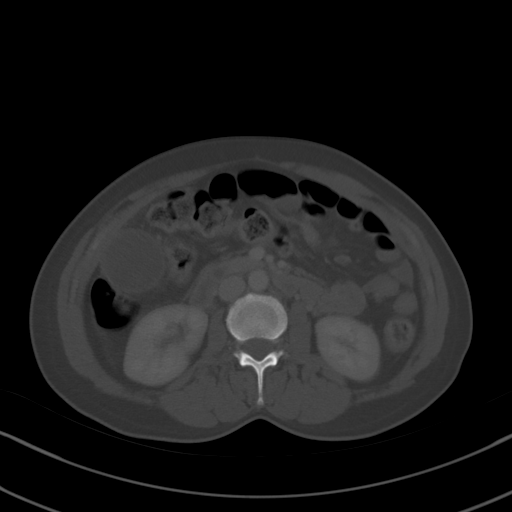
[im 59/81  soft-tissue]
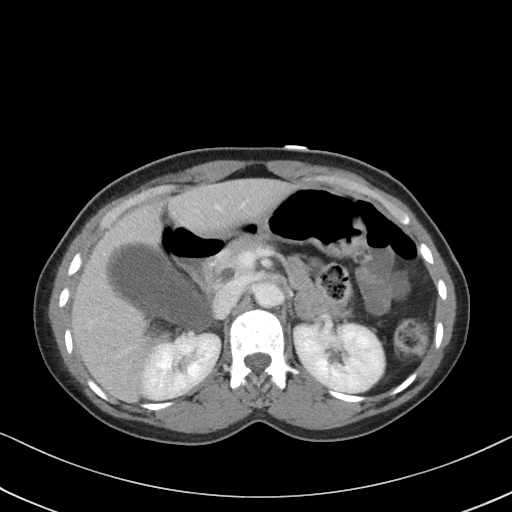
[im 64/81  soft-tissue]
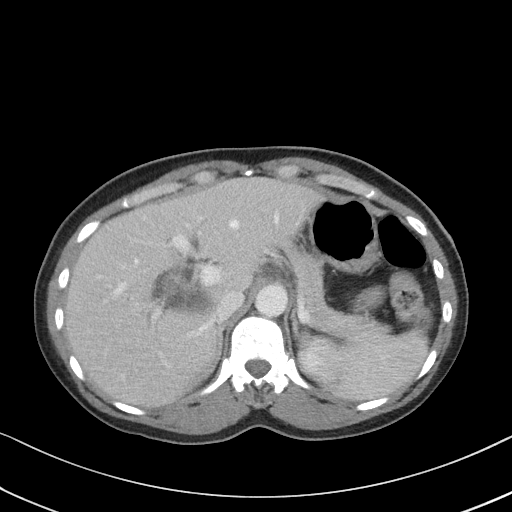
[im 68/81  soft-tissue]
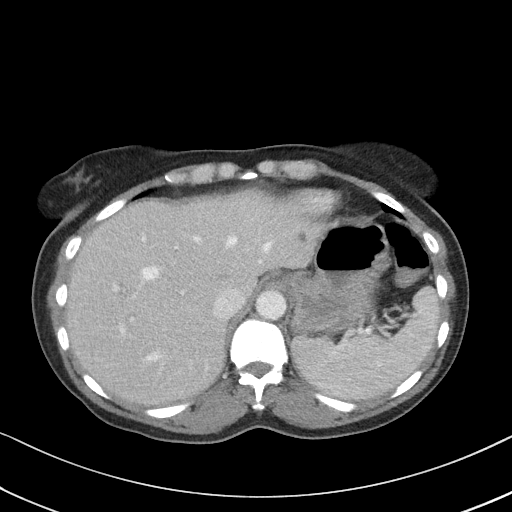
[im 76/81  soft-tissue]
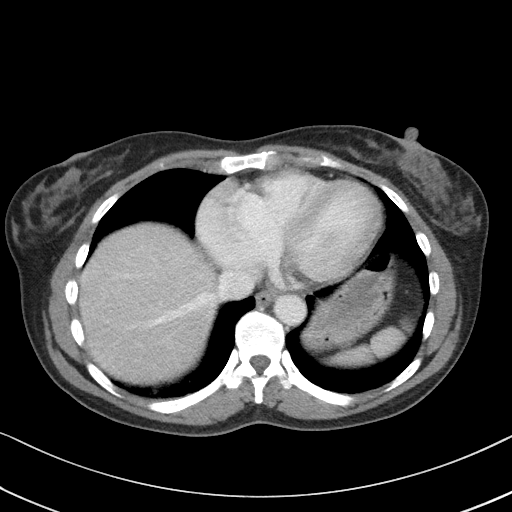

[Series 6: coronal st · coronal · 0.68mm/px · 3 of 72 slices shown]
[im 24/72  soft-tissue]
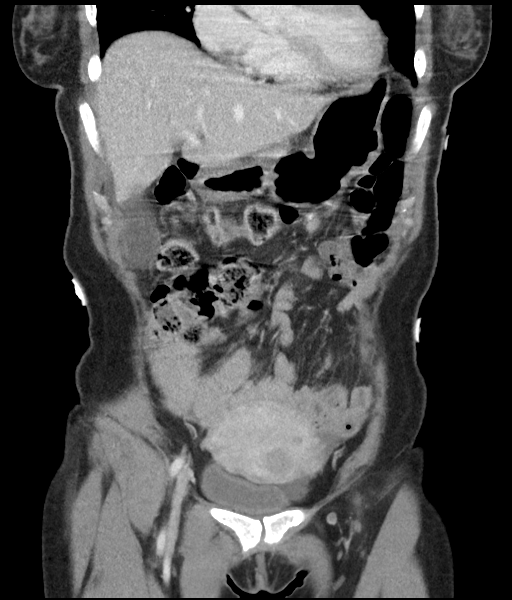
[im 32/72  soft-tissue]
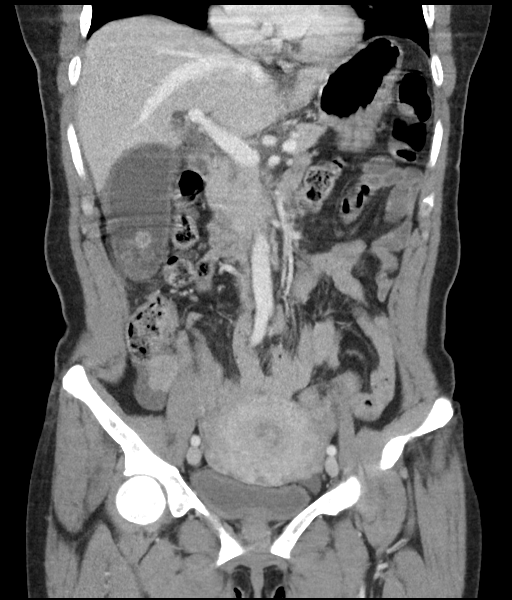
[im 40/72  soft-tissue]
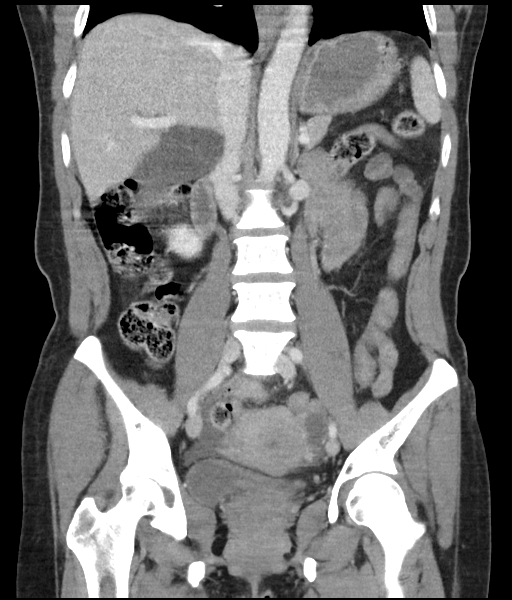

[16 of 46 positions shown; findings below may reference images not displayed]

FINDINGS: Lower chest: There is a trace right-sided pleural effusion with
adjacent atelectasis.The heart size is normal.

Hepatobiliary: The liver is normal. There appears to be diffuse
gallbladder wall thickening. The gallbladder is distended. Multiple
gallstones are noted. There appear to be gallstones at the level of
the cystic duct. There is intrahepatic and extrahepatic biliary
ductal dilatation.

Pancreas: Normal contours without ductal dilatation. No
peripancreatic fluid collection.

Spleen: Unremarkable.

Adrenals/Urinary Tract:

--Adrenal glands: Unremarkable.

--Right kidney/ureter: No hydronephrosis or radiopaque kidney
stones.

--Left kidney/ureter: No hydronephrosis or radiopaque kidney stones.

--Urinary bladder: Unremarkable.

Stomach/Bowel:

--Stomach/Duodenum: No hiatal hernia or other gastric abnormality.
Normal duodenal course and caliber.

--Small bowel: Unremarkable.

--Colon: Unremarkable.

--Appendix: Normal.

Vascular/Lymphatic: Normal course and caliber of the major abdominal
vessels.

--No retroperitoneal lymphadenopathy.

--No mesenteric lymphadenopathy.

--No pelvic or inguinal lymphadenopathy.

Reproductive: The uterus is enlarged with multiple fibroids.

Other: There is a small volume of pelvic free fluid which is likely
physiologic. No free air. The abdominal wall is normal.

Musculoskeletal. No acute displaced fractures.
IMPRESSION: 1. Findings concerning for acute cholecystitis with gallstones at
the level of the cystic duct. There is intrahepatic and extrahepatic
biliary ductal dilatation which raises suspicion for
choledocholithiasis. Consider further evaluation by ultrasound as
clinically indicated.
2. Trace right-sided pleural effusion with adjacent atelectasis.
3. Fibroid uterus.
4. Small volume of pelvic free fluid is likely physiologic.

## 2022-03-29 IMAGING — US US ABDOMEN LIMITED
1 series · 13 of 25 positions shown · non-contrast
Comparison: Abdominopelvic CT earlier today.

CLINICAL DATA: Epigastric pain with nausea and vomiting. Abnormal
CT scan today. Positive [place].

EXAM:
ULTRASOUND ABDOMEN LIMITED RIGHT UPPER QUADRANT

[Series 1: us abdomen limited · 13 of 57 slices shown]
[im 1/57]
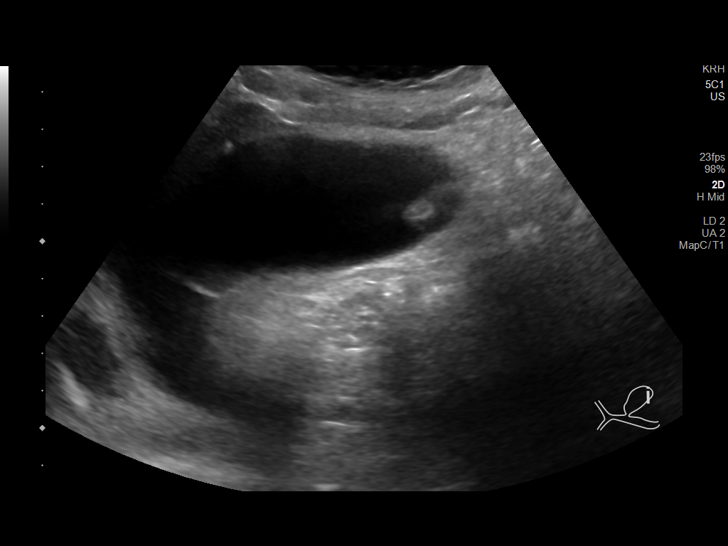
[im 5/57]
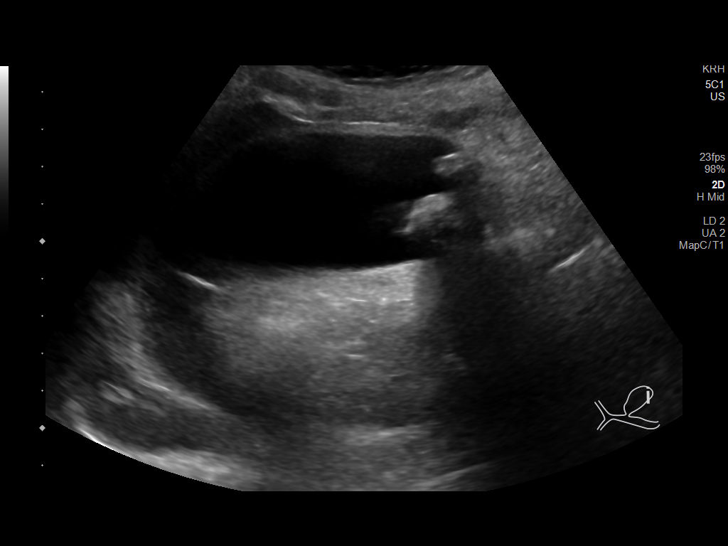
[im 10/57]
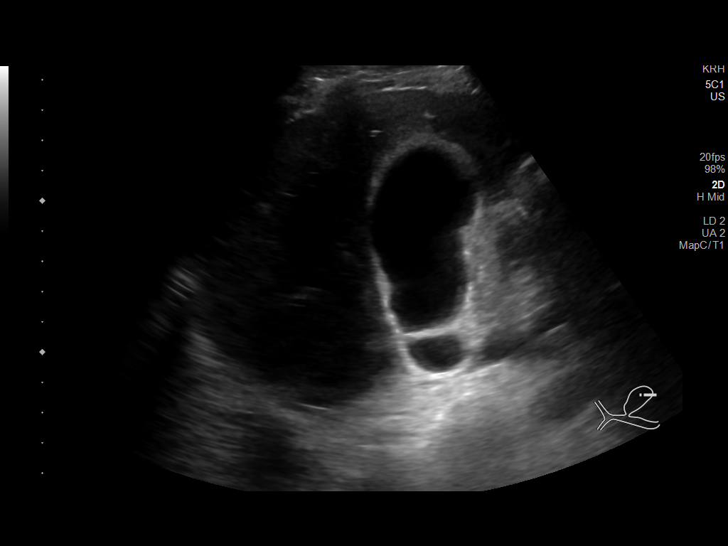
[im 15/57]
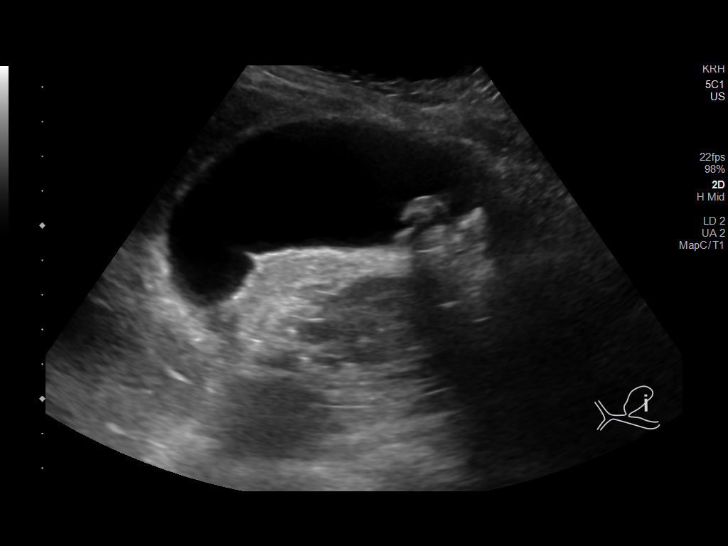
[im 19/57]
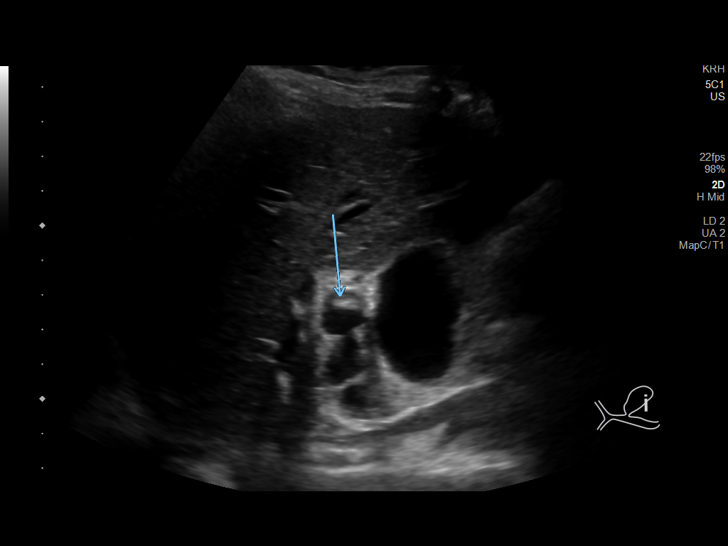
[im 24/57]
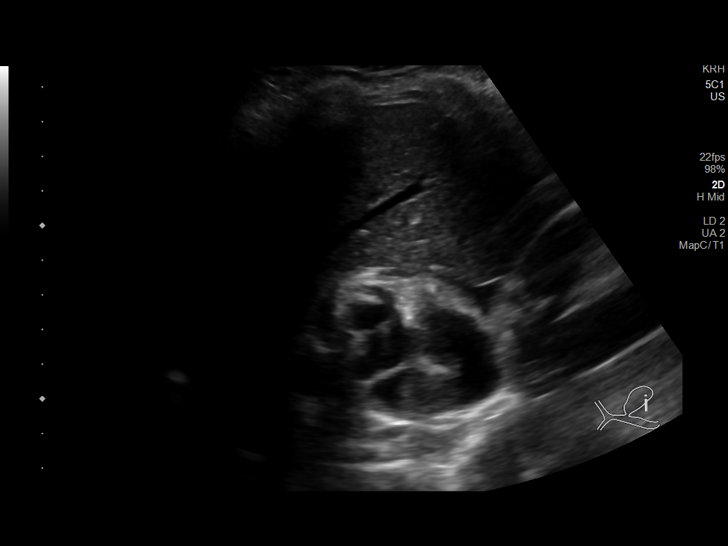
[im 29/57]
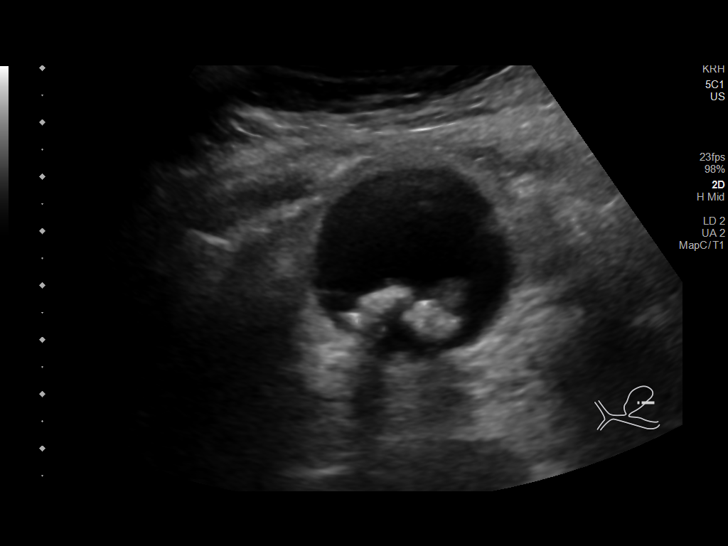
[im 33/57]
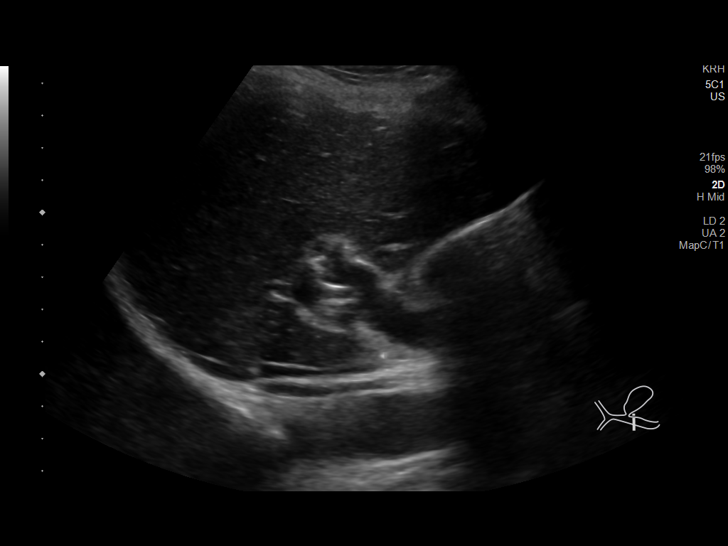
[im 38/57]
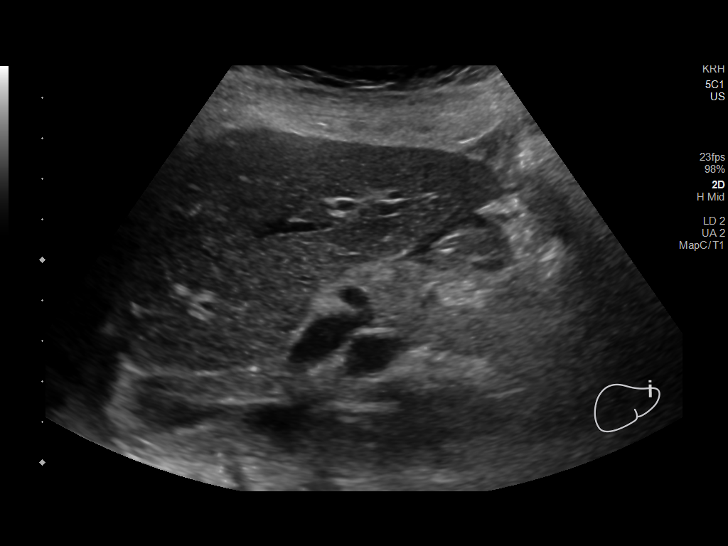
[im 43/57]
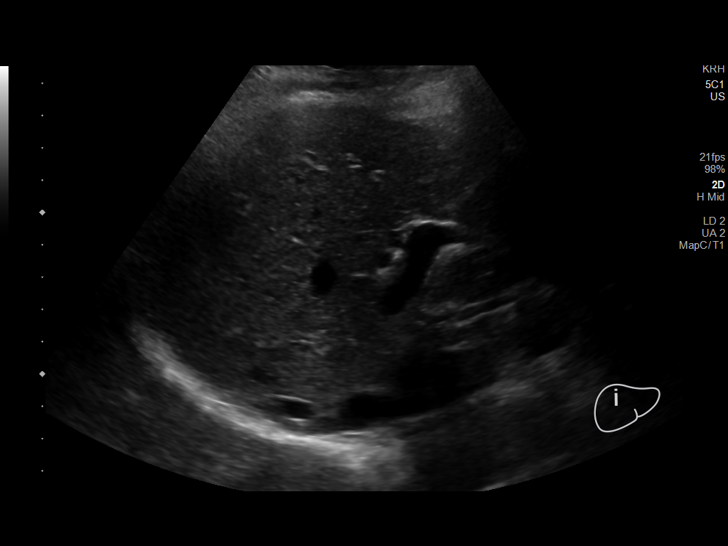
[im 47/57]
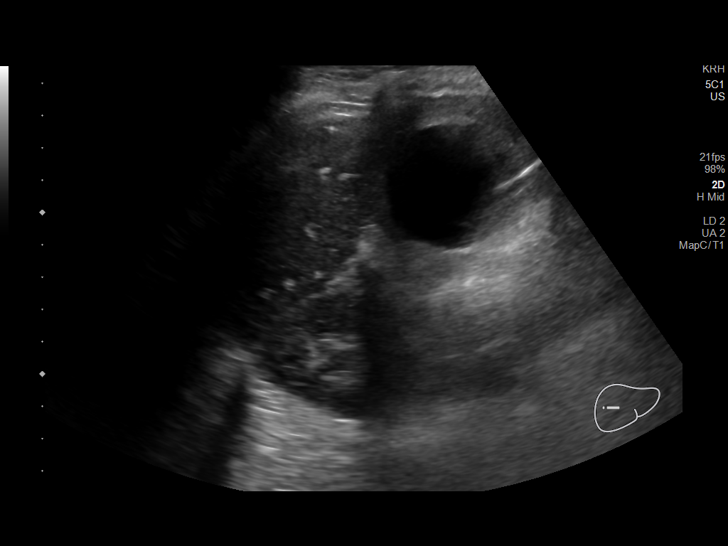
[im 52/57]
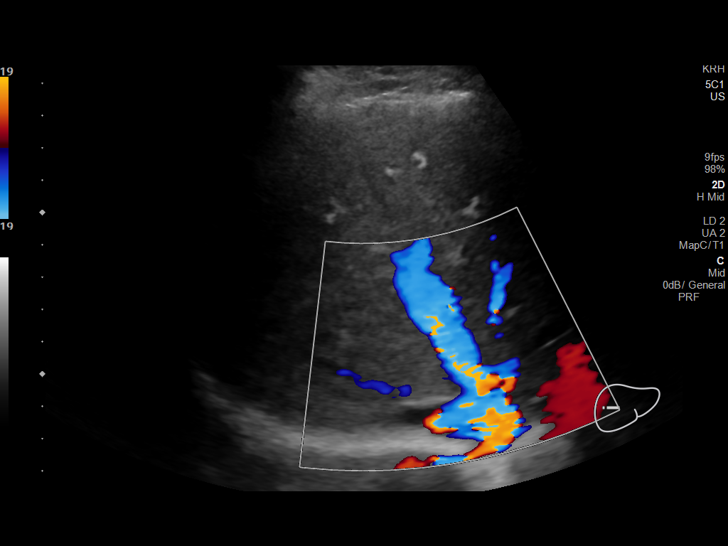
[im 57/57]
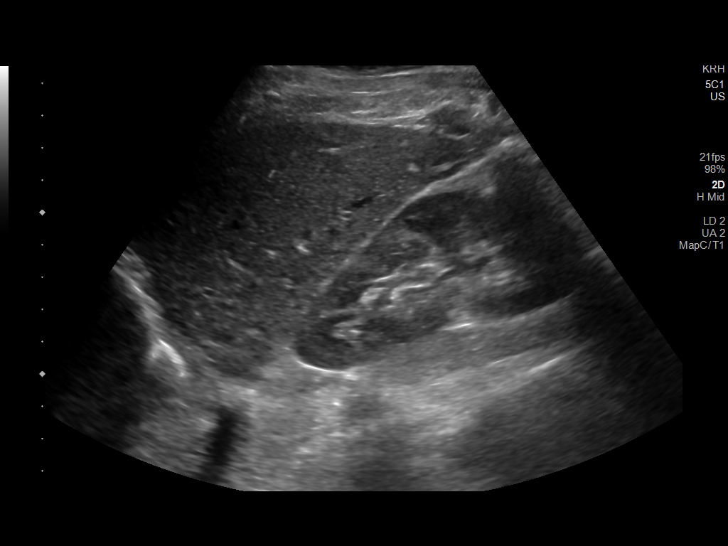

[13 of 25 positions shown; findings below may reference images not displayed]

FINDINGS: Gallbladder:

Distended containing intraluminal gallstones as well as a 1 cm
shadowing stone in the cystic duct. There is diffuse gallbladder
wall thickening at 4 mm. Small amount of pericholecystic fluid.
Positive sonographic Murphy sign was noted by the sonographer.

Common bile duct:

Diameter: Dilated at 8 mm.  No demonstrated choledocholithiasis.

Liver:

Cyst in the left lobe of the liver on CT is not well seen by
ultrasound. There is central intrahepatic biliary ductal dilatation.
Portal vein is patent on color Doppler imaging with normal direction
of blood flow towards the liver.

Other: None.
IMPRESSION: 1. Sonographic findings consistent with acute cholecystitis.
Distended gallbladder with intraluminal gallstones, 1 cm stone in
the cystic duct, gallbladder wall thickening, pericholecystic fluid,
and positive sonographic Murphy sign.
2. Intrahepatic and extrahepatic biliary ductal dilatation, common
bile duct measures 8 mm. No demonstrated choledocholithiasis by
ultrasound.

## 2022-03-30 IMAGING — RF DG CHOLANGIOGRAM OPERATIVE
1 series · 4 of 4 positions shown · non-contrast
Comparison: Abdominal ultrasound 10/25/2019; CT scan of the abdomen
and pelvis 10/25/2019

CLINICAL DATA: 54-year-old female undergoing elective laparoscopic
cholecystectomy with intraoperative cholangiogram

EXAM:
INTRAOPERATIVE CHOLANGIOGRAM
TECHNIQUE: Cholangiographic images from the C-arm fluoroscopic device were
submitted for interpretation post-operatively. Please see the
procedural report for the amount of contrast and the fluoroscopy
time utilized.

[Series 1: run · 4 of 213 frames shown]
[frame 32/213]
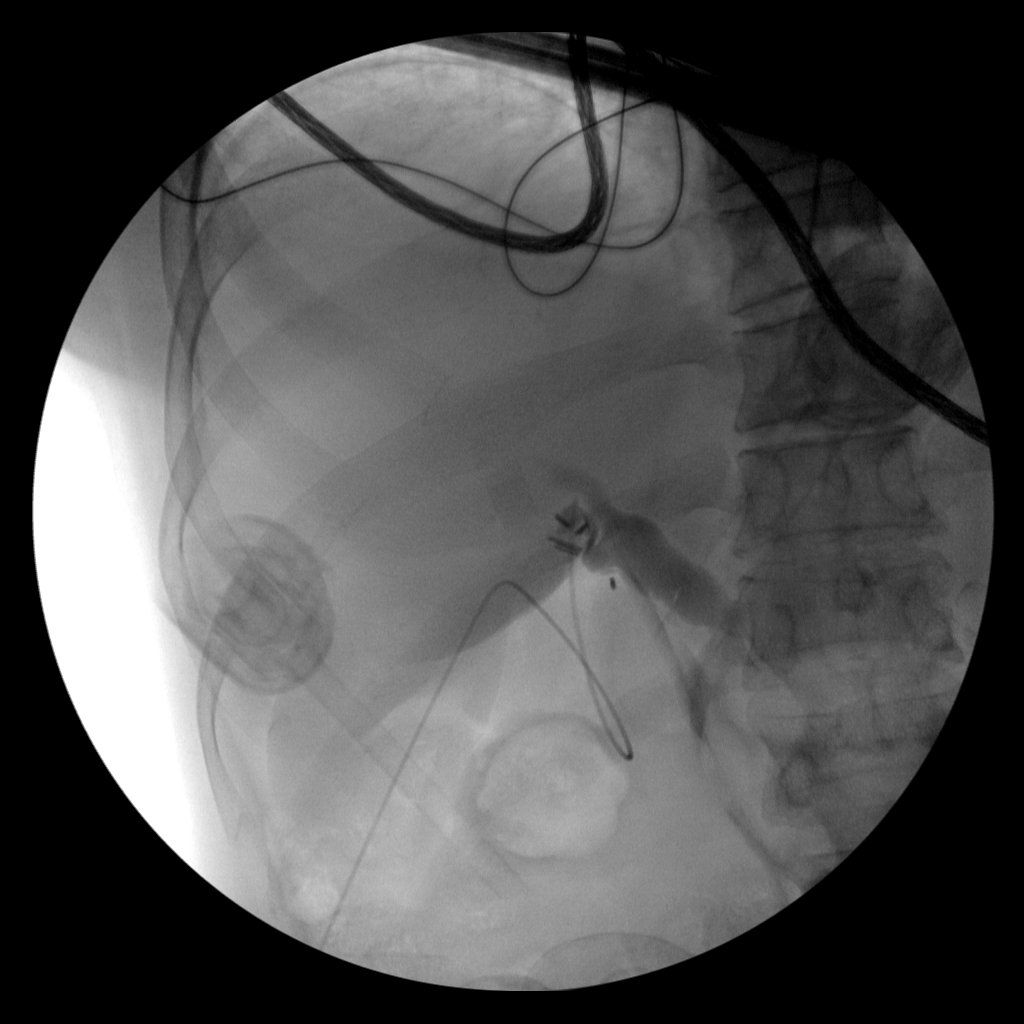
[frame 107/213]
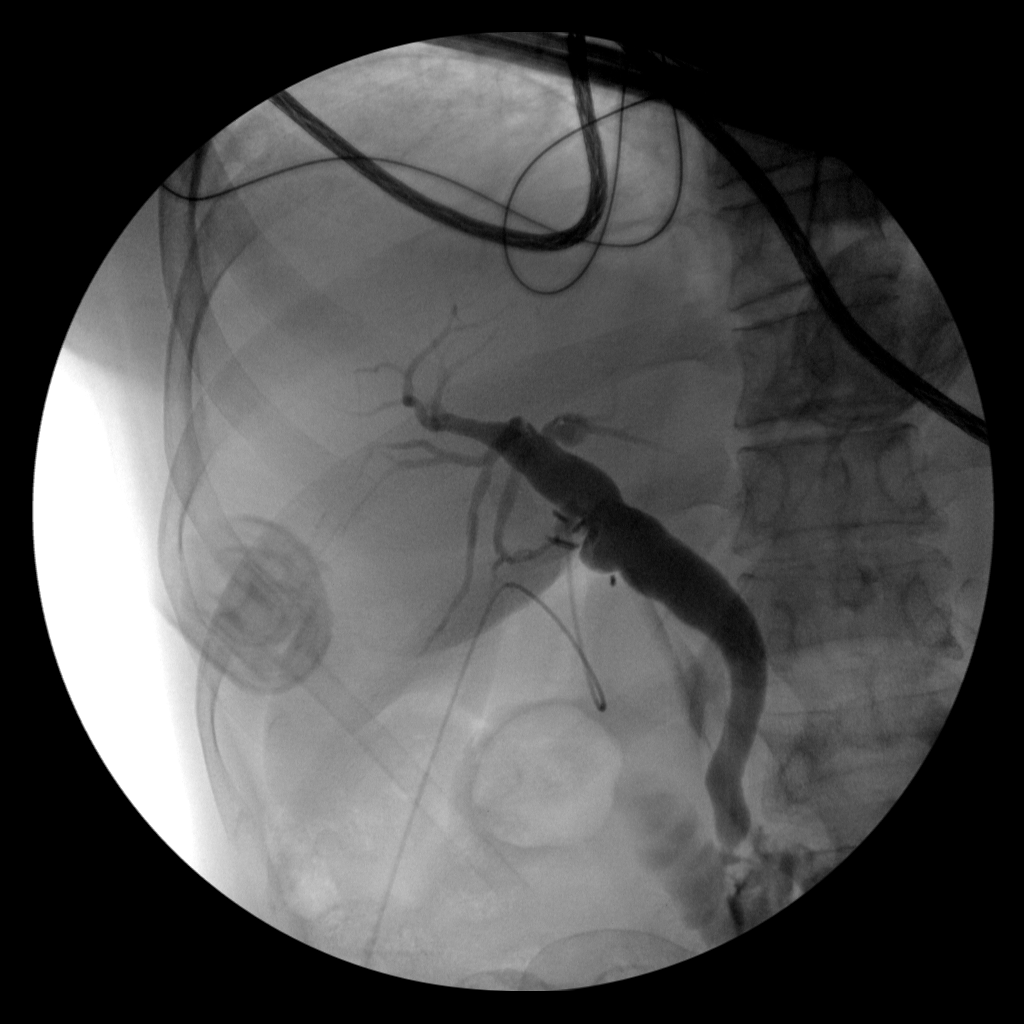
[frame 182/213]
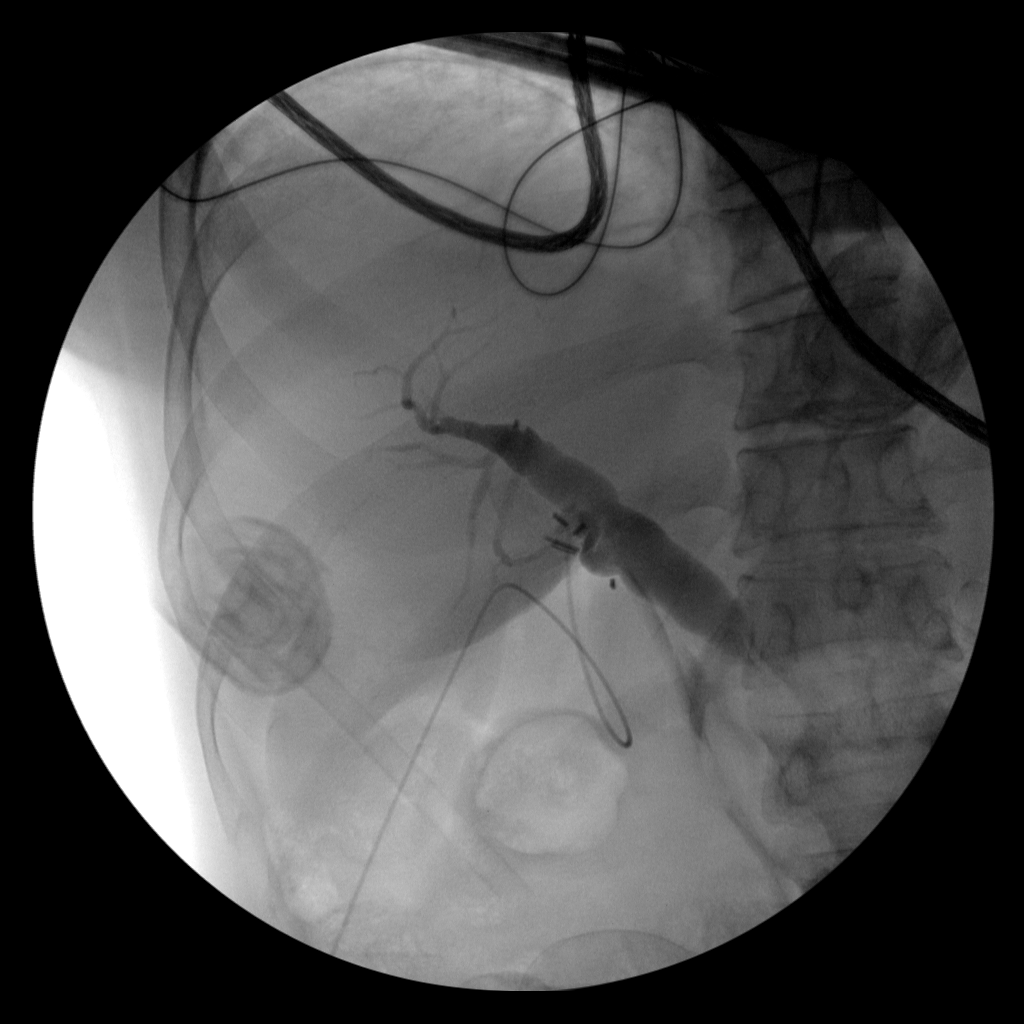
[frame 213/213  full-range]
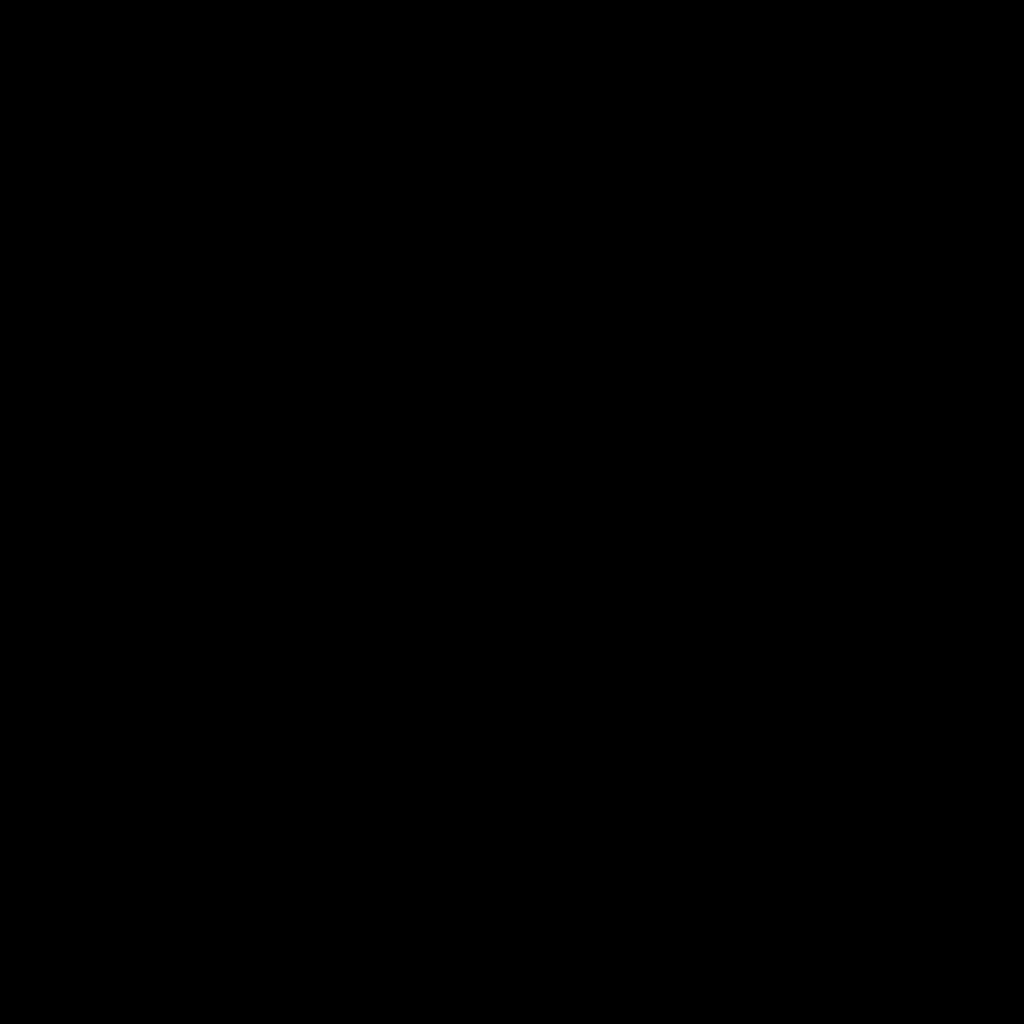

[4 of 4 positions shown; findings below may reference images not displayed]

FINDINGS: A saved fluoroscopic clip is submitted for review. The images
demonstrate cannulation of the cystic duct remanent and
intraoperative cholangiogram. The common hepatic and common bile
ducts are diffusely dilated. However, no discrete stricture,
stenosis or filling defect to suggest choledocholithiasis is
identified. Contrast material does pass freely through the distal
common bile duct and into the duodenum.
IMPRESSION: 1. Nonspecific dilation of the common hepatic and common bile ducts
without evidence of stenosis, stricture or choledocholithiasis.
